# Patient Record
Sex: Male | Born: 1990
Health system: Southern US, Community
[De-identification: ages and names within clinical notes are randomized; demographics above are authoritative.]

## PROBLEM LIST (undated history)

## (undated) DIAGNOSIS — H53009 Unspecified amblyopia, unspecified eye: Secondary | ICD-10-CM

## (undated) DIAGNOSIS — J45909 Unspecified asthma, uncomplicated: Secondary | ICD-10-CM

## (undated) DIAGNOSIS — I10 Essential (primary) hypertension: Secondary | ICD-10-CM

## (undated) HISTORY — DX: Unspecified amblyopia, unspecified eye: H53.009

## (undated) HISTORY — DX: Unspecified asthma, uncomplicated: J45.909

---
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
## 2014-08-07 ENCOUNTER — Emergency Department: Payer: Self-pay | Admitting: Emergency Medicine

## 2015-01-02 ENCOUNTER — Ambulatory Visit (INDEPENDENT_AMBULATORY_CARE_PROVIDER_SITE_OTHER): Payer: PRIVATE HEALTH INSURANCE | Admitting: Family Medicine

## 2015-01-02 ENCOUNTER — Ambulatory Visit
Admission: RE | Admit: 2015-01-02 | Discharge: 2015-01-02 | Disposition: A | Discharge status: Home / Self Care | Payer: PRIVATE HEALTH INSURANCE | Source: Ambulatory Visit | Attending: Family Medicine | Admitting: Family Medicine

## 2015-01-02 ENCOUNTER — Encounter: Payer: Self-pay | Admitting: Family Medicine

## 2015-01-02 ENCOUNTER — Ambulatory Visit
Admission: RE | Admit: 2015-01-02 | Payer: PRIVATE HEALTH INSURANCE | Source: Ambulatory Visit | Admitting: Family Medicine

## 2015-01-02 VITALS — BP 160/64 | HR 80 | Temp 98.1°F | Resp 16 | Ht >= 80 in | Wt 322.0 lb

## 2015-01-02 DIAGNOSIS — G8929 Other chronic pain: Secondary | ICD-10-CM | POA: Insufficient documentation

## 2015-01-02 DIAGNOSIS — H53009 Unspecified amblyopia, unspecified eye: Secondary | ICD-10-CM | POA: Insufficient documentation

## 2015-01-02 DIAGNOSIS — R229 Localized swelling, mass and lump, unspecified: Secondary | ICD-10-CM

## 2015-01-02 DIAGNOSIS — IMO0002 Reserved for concepts with insufficient information to code with codable children: Secondary | ICD-10-CM | POA: Insufficient documentation

## 2015-01-02 DIAGNOSIS — R05 Cough: Secondary | ICD-10-CM

## 2015-01-02 DIAGNOSIS — M545 Low back pain, unspecified: Secondary | ICD-10-CM | POA: Insufficient documentation

## 2015-01-02 DIAGNOSIS — S3210XS Unspecified fracture of sacrum, sequela: Secondary | ICD-10-CM | POA: Diagnosis not present

## 2015-01-02 DIAGNOSIS — R059 Cough, unspecified: Secondary | ICD-10-CM

## 2015-01-02 DIAGNOSIS — Z0000 Encounter for general adult medical examination without abnormal findings: Secondary | ICD-10-CM

## 2015-01-02 MED ORDER — MOMETASONE FUROATE 100 MCG/ACT IN AERO: 2.0000 | INHALATION_SPRAY | Freq: Two times a day (BID) | RESPIRATORY_TRACT | Status: DC

## 2015-01-02 MED ORDER — AZITHROMYCIN 250 MG PO TABS: ORAL_TABLET | ORAL | Status: AC
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
## 2015-01-03 LAB — GLUCOSE, RANDOM: Glucose: 91 mg/dL (ref 65–99)

## 2015-01-03 LAB — LIPID PANEL
CHOLESTEROL TOTAL: 128 mg/dL (ref 100–199)
Chol/HDL Ratio: 3.5 ratio units (ref 0.0–5.0)
HDL: 37 mg/dL — AB (ref >39)
LDL CALC: 69 mg/dL (ref 0–99)
TRIGLYCERIDES: 109 mg/dL (ref 0–149)
VLDL CHOLESTEROL CAL: 22 mg/dL (ref 5–40)

## 2015-03-27 ENCOUNTER — Ambulatory Visit (INDEPENDENT_AMBULATORY_CARE_PROVIDER_SITE_OTHER): Payer: PRIVATE HEALTH INSURANCE | Admitting: Family Medicine

## 2015-03-27 ENCOUNTER — Encounter: Payer: Self-pay | Admitting: Family Medicine

## 2015-03-27 VITALS — BP 148/80 | HR 90 | Temp 98.3°F | Resp 18 | Ht >= 80 in | Wt 297.0 lb

## 2015-03-27 DIAGNOSIS — Z23 Encounter for immunization: Secondary | ICD-10-CM

## 2015-03-27 DIAGNOSIS — Z7251 High risk heterosexual behavior: Secondary | ICD-10-CM | POA: Diagnosis not present

## 2015-03-27 NOTE — Progress Notes (Signed)
       Patient: Joshua Hendrix Male    DOB: [birth year]   24 y.o.   MRN: [id number] Visit Date: 03/27/2015  Today's Provider: [name], MD   Chief Complaint  Patient presents with  . Exposure to STD    possible   Subjective:    Exposure to STD Primary symptoms comment: no current symptoms. The patient is experiencing no pain. Pertinent negatives include no abdominal pain, chest pain, chills, fever, nausea, shortness of breath or vomiting. He is not sexually active. He inconsistently uses condoms.  Patient comes in today requesting to be checked for STD's. Patient states his girlfriend was unfaithful and cheated on him with another guy. Patient does not have any symptoms of STD'S but wants to be checked just for peace of mind. He denies having any systemic or GU symptoms.      No Known Allergies Previous Medications   ACETAMINOPHEN (TYLENOL) 500 MG TABLET    Take 1 tablet by mouth daily as needed.   MOMETASONE FUROATE (ASMANEX HFA) 100 MCG/ACT AERO    Inhale 2 puffs into the lungs 2 (two) times daily.    Review of Systems  Constitutional: Negative for fever, chills and appetite change.  Respiratory: Negative for chest tightness, shortness of breath and wheezing.   Cardiovascular: Negative for chest pain and palpitations.  Gastrointestinal: Negative for nausea, vomiting and abdominal pain.    Social History  Substance Use Topics  . Smoking status: Former Games developer  . Smokeless tobacco: Not on file  . Alcohol Use: No   Objective:   BP 148/80 mmHg  Pulse 90  Temp(Src) 98.3 F (36.8 C) (Oral)  Resp 18  Ht 7' (2.134 m)  Wt 297 lb (134.718 kg)  BMI 29.58 kg/m2  SpO2 100%  Physical Exam  General appearance: alert, well developed, well nourished, cooperative and in no distress Head: Normocephalic, without obvious abnormality, atraumatic Lungs: Respirations even and unlabored Extremities: No gross deformities Skin: Skin color, texture, turgor normal. No rashes seen    Psych: Appropriate mood and affect. Neurologic: Mental status: Alert, oriented to person, place, and time, thought content appropriate.      Assessment & Plan:     1. High-risk sexual behavior  - HIV antibody (with reflex) - RPR - GC/Chlamydia Probe Amp  2. Need for HPV vaccination  - HPV 9-valent vaccine,Recombinat (Gardasil 9)  Return after 1 month for HPV #2 [name], MD  [facility]

## 2015-03-28 LAB — HIV ANTIBODY (ROUTINE TESTING W REFLEX): HIV Screen 4th Generation wRfx: NONREACTIVE

## 2015-03-28 LAB — RPR: RPR Ser Ql: NONREACTIVE

## 2015-03-30 LAB — GC/CHLAMYDIA PROBE AMP
CHLAMYDIA, DNA PROBE: NEGATIVE
Neisseria gonorrhoeae by PCR: NEGATIVE

## 2015-04-27 ENCOUNTER — Ambulatory Visit: Payer: PRIVATE HEALTH INSURANCE | Admitting: Family Medicine

## 2015-05-04 ENCOUNTER — Ambulatory Visit (INDEPENDENT_AMBULATORY_CARE_PROVIDER_SITE_OTHER): Payer: PRIVATE HEALTH INSURANCE | Admitting: Family Medicine

## 2015-05-04 DIAGNOSIS — Z23 Encounter for immunization: Secondary | ICD-10-CM

## 2015-05-07 NOTE — Progress Notes (Signed)
Immunization only

## 2015-12-05 ENCOUNTER — Telehealth: Payer: Self-pay | Admitting: Family Medicine

## 2015-12-05 NOTE — Telephone Encounter (Signed)
Patient was notified he is due now for 3rd HPV inj. Patient scheduled appt.

## 2015-12-05 NOTE — Telephone Encounter (Signed)
Pt states he want to know if he has completed the series of shot that were required for the series.  He is unsure the name of the shots.  He thinks he might have missed the third shot but can not remember.

## 2015-12-06 ENCOUNTER — Ambulatory Visit (INDEPENDENT_AMBULATORY_CARE_PROVIDER_SITE_OTHER): Payer: PRIVATE HEALTH INSURANCE | Admitting: *Deleted

## 2015-12-06 DIAGNOSIS — Z23 Encounter for immunization: Secondary | ICD-10-CM | POA: Diagnosis not present
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
## 2016-11-26 ENCOUNTER — Encounter: Payer: Self-pay | Admitting: Family Medicine

## 2016-11-26 ENCOUNTER — Ambulatory Visit (INDEPENDENT_AMBULATORY_CARE_PROVIDER_SITE_OTHER): Payer: PRIVATE HEALTH INSURANCE | Admitting: Family Medicine

## 2016-11-26 VITALS — BP 134/70 | HR 86 | Temp 98.1°F | Resp 16 | Ht >= 80 in | Wt 333.0 lb

## 2016-11-26 DIAGNOSIS — Z0000 Encounter for general adult medical examination without abnormal findings: Secondary | ICD-10-CM | POA: Diagnosis not present

## 2016-11-26 DIAGNOSIS — Z23 Encounter for immunization: Secondary | ICD-10-CM

## 2016-11-26 DIAGNOSIS — Z3009 Encounter for other general counseling and advice on contraception: Secondary | ICD-10-CM

## 2016-11-26 NOTE — Progress Notes (Deleted)
Patient: Joshua Hendrix, Male    DOB: 05/31/1991, 26 y.o.   MRN: [id number] Visit Date: 11/26/2016  Today's Provider: Lelon Huh, MD   Chief Complaint  Patient presents with  . Annual Exam  . Back Pain   Subjective:    Annual physical exam Joshua Hendrix is a 26 y.o. male who presents today for health maintenance and complete physical. He feels well. He reports exercising running three to four days a week. He reports he is sleeping well.  -----------------------------------------------------------------  He states that he has been considering having a vasectomy for several years and requests referral to urologist. He has no children, but states if he every decides to have children he wants to adopt instead of having his own. He has a long time steady girlfriend and states she feels the same way. He said he is 379% certain he will not want to have children of his own in the future.   Review of Systems  Constitutional: Negative for chills, diaphoresis and fever.  HENT: Negative for congestion, ear discharge, ear pain, hearing loss, nosebleeds, sore throat and tinnitus.   Eyes: Negative for photophobia, pain, discharge and redness.  Respiratory: Negative for cough, shortness of breath, wheezing and stridor.   Cardiovascular: Negative for chest pain, palpitations and leg swelling.  Gastrointestinal: Negative for abdominal pain, blood in stool, constipation, diarrhea, nausea and vomiting.  Endocrine: Negative for polydipsia.  Genitourinary: Negative for dysuria, flank pain, frequency, hematuria and urgency.  Musculoskeletal: Negative for back pain, myalgias and neck pain.  Skin: Negative for rash.  Allergic/Immunologic: Negative for environmental allergies.  Neurological: Negative for dizziness, tremors, seizures, weakness and headaches.  Hematological: Does not bruise/bleed easily.  Psychiatric/Behavioral: Negative for hallucinations and suicidal ideas. The patient is not  nervous/anxious.     Social History      He  reports that he has quit smoking. He has never used smokeless tobacco. He reports that he does not drink alcohol or use drugs.       Social History   Social History  . Marital status: Single    Spouse name: N/A  . Number of children: N/A  . Years of education: N/A   Social History Main Topics  . Smoking status: Former Research scientist (life sciences)  . Smokeless tobacco: Never Used  . Alcohol use No  . Drug use: No  . Sexual activity: Not Asked   Other Topics Concern  . None   Social History Narrative  . None    Past Medical History:  Diagnosis Date  . Amblyopia      Patient Active Problem List   Diagnosis Date Noted  . Amblyopia 01/02/2015  . LBP (low back pain) 01/02/2015  . Mass 01/02/2015    Past Surgical History:  Procedure Laterality Date  . CYST REMOVAL NECK  12/11/2009  . TONSILLECTOMY  1997    Family History        Family Status  Relation Status  . Mother Alive  . Father Alive       Fatty liver  . MGF Deceased at age 70  . Neg Hx (Not Specified)        His family history includes Heart Problems in his mother; Heart attack in his maternal grandfather; Hyperlipidemia in his father; Hypertension in his father; Stroke in his maternal grandfather.     No Known Allergies  No current outpatient prescriptions on file.   Patient Care Team: Birdie Sons, MD as PCP -  General (Family Medicine)      Objective:   Vitals: BP 134/70 (BP Location: Right Arm, Patient Position: Sitting, Cuff Size: Large)   Pulse 86   Temp 98.1 F (36.7 C) (Oral)   Resp 16   Ht 7' (2.134 m)   Wt (!) 333 lb (151 kg)   SpO2 97%   BMI 33.18 kg/m    Vitals:   11/26/16 0915  BP: 134/70  Pulse: 86  Resp: 16  Temp: 98.1 F (36.7 C)  TempSrc: Oral  SpO2: 97%  Weight: (!) 333 lb (151 kg)  Height: 7' (2.134 m)     Physical Exam   General Appearance:    Alert, cooperative, no distress, appears stated age  Head:    Normocephalic,  without obvious abnormality, atraumatic  Eyes:    PERRL, conjunctiva/corneas clear, EOM's intact, fundi    benign, both eyes       Ears:    Normal TM's and external ear canals, both ears  Nose:   Nares normal, septum midline, mucosa normal, no drainage   or sinus tenderness  Throat:   Lips, mucosa, and tongue normal; teeth and gums normal  Neck:   Supple, symmetrical, trachea midline, no adenopathy;       thyroid:  No enlargement/tenderness/nodules; no carotid   bruit or JVD  Back:     Symmetric, no curvature, ROM normal, no CVA tenderness  Lungs:     Clear to auscultation bilaterally, respirations unlabored  Chest wall:    No tenderness or deformity  Heart:    Regular rate and rhythm, S1 and S2 normal, no murmur, rub   or gallop  Abdomen:     Soft, non-tender, bowel sounds active all four quadrants,    no masses, no organomegaly  Genitalia:    deferred  Rectal:    deferred  Extremities:   Extremities normal, atraumatic, no cyanosis or edema  Pulses:   2+ and symmetric all extremities  Skin:   Skin color, texture, turgor normal, no rashes or lesions  Lymph nodes:   Cervical, supraclavicular, and axillary nodes normal  Neurologic:   CNII-XII intact. Normal strength, sensation and reflexes      throughout    Depression Screen PHQ 2/9 Scores 11/26/2016 01/02/2015  PHQ - 2 Score 0 0  PHQ- 9 Score 0 0      Assessment & Plan:     Routine Health Maintenance and Physical Exam  Exercise Activities and Dietary recommendations Goals    None      Immunization History  Administered Date(s) Administered  . DTaP 10/19/1991, 12/22/1991, 02/28/1992, 03/15/1993, 09/26/1996  . HPV 9-valent 03/27/2015, 05/04/2015, 12/06/2015  . Hepatitis A 07/07/2006, 02/28/2009  . Hepatitis B 07/18/2009, 08/22/2009, 11/20/2009  . HiB (PRP-OMP) 10/19/1991, 12/22/1991, 02/28/1992, 03/15/1993  . IPV 10/19/1991, 12/22/1991, 03/15/1993, 09/26/1996  . MMR 11/23/1992, 09/26/1996  . Meningococcal Conjugate  02/28/2009  . Td 07/07/2006  . Tdap 07/07/2006  . Varicella 02/26/1994, 07/07/2006    Health Maintenance  Topic Date Due  . Samul Dada  07/07/2016  . INFLUENZA VACCINE  01/14/2017  . HIV Screening  Completed     Discussed health benefits of physical activity, and encouraged him to engage in regular exercise appropriate for his age and condition.    --------------------------------------------------------------------  1. Annual physical exam Doing well. Last screening labs in 2016 were completely normal.   2. Need for prophylactic vaccination using tetanus and diphtheria toxoids adsorbed (Td) vaccine  - Td : Tetanus/diphtheria >7yo Preservative  free  3. Visit for vasectomy evaluation He feels strongly that he never wants to have children of his own and requests referral to urology to discuss vasectomy.  - Ambulatory referral to Urology   Lelon Huh, MD  [facility]

## 2016-11-26 NOTE — Progress Notes (Signed)
Patient: Joshua Hendrix, Male    DOB: [birth year], 26 y.o.   MRN: [id number] Visit Date: 11/26/2016  Today's Provider: Lelon Huh, MD   Chief Complaint  Patient presents with  . Annual Exam  . Back Pain   Subjective:    Annual physical exam Joshua Hendrix is a 26 y.o. male who presents today for health maintenance and complete physical. He feels well. He reports exercising yes. He reports he is sleeping fairly well.  ----------------------------------------------------------------  He states that he has been considering having a vasectomy for several years and requests referral to urologist. He has no children, but states if he every decides to have children he wants to adopt instead of having his own. He has a long time steady girlfriend and states she feels the same way. He said he is 950% certain he will not want to have children of his own in the future.   Review of Systems  Constitutional: Negative for chills, diaphoresis and fever.  HENT: Negative for congestion, ear discharge, ear pain, hearing loss, nosebleeds, sore throat and tinnitus.   Eyes: Negative for photophobia, pain, discharge and redness.  Respiratory: Negative for cough, shortness of breath, wheezing and stridor.   Cardiovascular: Negative for chest pain, palpitations and leg swelling.  Gastrointestinal: Negative for abdominal pain, blood in stool, constipation, diarrhea, nausea and vomiting.  Endocrine: Negative for polydipsia.  Genitourinary: Negative for dysuria, flank pain, frequency, hematuria and urgency.  Musculoskeletal: Negative for back pain, myalgias and neck pain.  Skin: Negative for rash.  Allergic/Immunologic: Negative for environmental allergies.  Neurological: Negative for dizziness, tremors, seizures, weakness and headaches.  Hematological: Does not bruise/bleed easily.  Psychiatric/Behavioral: Negative for hallucinations and suicidal ideas. The patient is not nervous/anxious.      Social History      He  reports that he has quit smoking. He has never used smokeless tobacco. He reports that he does not drink alcohol or use drugs.       Social History   Social History  . Marital status: Single    Spouse name: N/A  . Number of children: N/A  . Years of education: N/A   Social History Main Topics  . Smoking status: Former Research scientist (life sciences)  . Smokeless tobacco: Never Used  . Alcohol use No  . Drug use: No  . Sexual activity: Not Asked   Other Topics Concern  . None   Social History Narrative  . None    Past Medical History:  Diagnosis Date  . Amblyopia      Patient Active Problem List   Diagnosis Date Noted  . Amblyopia 01/02/2015  . LBP (low back pain) 01/02/2015  . Mass 01/02/2015    Past Surgical History:  Procedure Laterality Date  . CYST REMOVAL NECK  12/11/2009  . TONSILLECTOMY  1997    Family History        Family Status  Relation Status  . Mother Alive  . Father Alive       Fatty liver  . MGF Deceased at age 42  . Neg Hx (Not Specified)        His family history includes Heart Problems in his mother; Heart attack in his maternal grandfather; Hyperlipidemia in his father; Hypertension in his father; Stroke in his maternal grandfather.     No Known Allergies  No current outpatient prescriptions on file.   Patient Care Team: Birdie Sons, MD as PCP - General (Family Medicine)  Objective:   Vitals: BP 134/70 (BP Location: Right Arm, Patient Position: Sitting, Cuff Size: Large)   Pulse 86   Temp 98.1 F (36.7 C) (Oral)   Resp 16   Ht 7' (2.134 m)   Wt (!) 333 lb (151 kg)   SpO2 97%   BMI 33.18 kg/m    Vitals:   11/26/16 0915  BP: 134/70  Pulse: 86  Resp: 16  Temp: 98.1 F (36.7 C)  TempSrc: Oral  SpO2: 97%  Weight: (!) 333 lb (151 kg)  Height: 7' (2.134 m)     Physical Exam   Depression Screen PHQ 2/9 Scores 11/26/2016 01/02/2015  PHQ - 2 Score 0 0  PHQ- 9 Score 0 0      Assessment & Plan:      Routine Health Maintenance and Physical Exam  Exercise Activities and Dietary recommendations Goals    None      Immunization History  Administered Date(s) Administered  . DTaP 10/19/1991, 12/22/1991, 02/28/1992, 03/15/1993, 09/26/1996  . HPV 9-valent 03/27/2015, 05/04/2015, 12/06/2015  . Hepatitis A 07/07/2006, 02/28/2009  . Hepatitis B 07/18/2009, 08/22/2009, 11/20/2009  . HiB (PRP-OMP) 10/19/1991, 12/22/1991, 02/28/1992, 03/15/1993  . IPV 10/19/1991, 12/22/1991, 03/15/1993, 09/26/1996  . MMR 11/23/1992, 09/26/1996  . Meningococcal Conjugate 02/28/2009  . Td 07/07/2006  . Tdap 07/07/2006  . Varicella 02/26/1994, 07/07/2006    Health Maintenance  Topic Date Due  . Samul Dada  07/07/2016  . INFLUENZA VACCINE  01/14/2017  . HIV Screening  Completed     Discussed health benefits of physical activity, and encouraged him to engage in regular exercise appropriate for his age and condition.    -------------------------------------------------------------------- 1. Annual physical exam Doing well. Last screening labs in 2016 were completely normal.   2. Need for prophylactic vaccination using tetanus and diphtheria toxoids adsorbed (Td) vaccine  - Td : Tetanus/diphtheria >7yo Preservative  free  3. Visit for vasectomy evaluation He feels strongly that he never wants to have children of his own and requests referral to urology to discuss vasectomy.  - Ambulatory referral to Urology    Lelon Huh, MD  [facility]

## 2016-12-25 ENCOUNTER — Encounter: Payer: Self-pay | Admitting: Urology

## 2016-12-25 ENCOUNTER — Ambulatory Visit (INDEPENDENT_AMBULATORY_CARE_PROVIDER_SITE_OTHER): Payer: PRIVATE HEALTH INSURANCE | Admitting: Urology

## 2016-12-25 VITALS — BP 147/79 | HR 91 | Ht >= 80 in | Wt 332.9 lb

## 2016-12-25 DIAGNOSIS — Z3009 Encounter for other general counseling and advice on contraception: Secondary | ICD-10-CM | POA: Diagnosis not present

## 2016-12-25 MED ORDER — DIAZEPAM 10 MG PO TABS: 10.0000 mg | ORAL_TABLET | Freq: Once | ORAL | 0 refills | Status: AC

## 2016-12-25 NOTE — Progress Notes (Signed)
12/25/2016 9:48 AM   Dutch Quint [birth year] [id number]  Referring provider: Malva Limes, MD [address] [place], [place] [address]  Chief Complaint  Patient presents with  . New Patient (Initial Visit)    Vas consult referred by     HPI: The patient is a 26 year old gentleman who presents today for evaluation for a vasectomy. He is currently engaged. He had his fiance desire no biological children and plan to adopt if they want children down the road. His fiance does have anatomical abnormalities of her uterus which makes pregnancy dangerous as well which also was guided his decision to undergo a vasectomy. He is aware that this is meant to be a permanent procedure. He has fiance have given him a lot of thought about this procedure and would like to move forward for vasectomy. He has no other urological history or problems. He voids well. No previous genitourinary surgeries. No history of kidney stones.     PMH: Past Medical History:  Diagnosis Date  . Amblyopia     Surgical History: Past Surgical History:  Procedure Laterality Date  . CYST REMOVAL NECK  12/11/2009  . TONSILLECTOMY  1997    Home Medications:  Allergies as of 12/25/2016   No Known Allergies     Medication List       Accurate as of 12/25/16  9:48 AM. Always use your most recent med list.          cetirizine 10 MG chewable tablet Commonly known as:  ZYRTEC Chew 10 mg by mouth daily.   diazepam 10 MG tablet Commonly known as:  VALIUM Take 1 tablet (10 mg total) by mouth once.   fexofenadine 180 MG tablet Commonly known as:  ALLEGRA Take 180 mg by mouth daily.       Allergies:  No Known Allergies  Family History: Family History  Problem Relation Age of Onset  . Heart Problems Mother        Heart valve problem  . Hypertension Father   . Hyperlipidemia Father   . Heart attack Maternal Grandfather        3-4 heart attacks occured in his 40's  . Stroke Maternal  Grandfather   . Cancer Neg Hx   . Diabetes Neg Hx   . Prostate cancer Neg Hx   . Kidney cancer Neg Hx   . Bladder Cancer Neg Hx     Social History:  reports that he has quit smoking. He has never used smokeless tobacco. He reports that he does not drink alcohol or use drugs.  ROS: UROLOGY Frequent Urination?: No Hard to postpone urination?: No Burning/pain with urination?: No Get up at night to urinate?: No Leakage of urine?: No Urine stream starts and stops?: No Trouble starting stream?: No Do you have to strain to urinate?: No Blood in urine?: No Urinary tract infection?: No Sexually transmitted disease?: No Injury to kidneys or bladder?: No Painful intercourse?: No Weak stream?: No Erection problems?: No Penile pain?: No  Gastrointestinal Nausea?: No Vomiting?: No Indigestion/heartburn?: No Diarrhea?: No Constipation?: No  Constitutional Fever: No Night sweats?: No Weight loss?: No Fatigue?: No  Skin Skin rash/lesions?: No Itching?: No  Eyes Blurred vision?: No Double vision?: No  Ears/Nose/Throat Sore throat?: No Sinus problems?: No  Hematologic/Lymphatic Swollen glands?: No Easy bruising?: No  Cardiovascular Leg swelling?: No Chest pain?: No  Respiratory Cough?: No Shortness of breath?: No  Endocrine Excessive thirst?: No  Musculoskeletal Back pain?: No Joint  pain?: No  Neurological Headaches?: No Dizziness?: No  Psychologic Depression?: No Anxiety?: No  Physical Exam: BP (!) 147/79   Pulse 91   Ht 7' (2.134 m)   Wt (!) 332 lb 14.4 oz (151 kg)   BMI 33.17 kg/m   Constitutional:  Alert and oriented, No acute distress. HEENT: [place] AT, moist mucus membranes.  Trachea midline, no masses. Cardiovascular: No clubbing, cyanosis, or edema. Respiratory: Normal respiratory effort, no increased work of breathing. GI: Abdomen is soft, nontender, nondistended, no abdominal masses GU: No CVA tenderness. Normal phallus. Testicles  descended equally bilaterally. Vas palpable bilaterally. Skin: No rashes, bruises or suspicious lesions. Lymph: No cervical or inguinal adenopathy. Neurologic: Grossly intact, no focal deficits, moving all 4 extremities. Psychiatric: Normal mood and affect.  Laboratory Data: No results found for: WBC, HGB, HCT, MCV, PLT  No results found for: CREATININE  No results found for: PSA  No results found for: TESTOSTERONE  No results found for: HGBA1C  Urinalysis No results found for: COLORURINE, APPEARANCEUR, LABSPEC, PHURINE, GLUCOSEU, HGBUR, BILIRUBINUR, KETONESUR, PROTEINUR, UROBILINOGEN, NITRITE, LEUKOCYTESUR   Assessment & Plan:    Today, we discussed what the vas deferens is, where it is located, and its function. We reviewed the procedure for vasectomy, it's risks, benefits, alternatives, and likelihood of achieving his goals. We discussed in detail the procedure, complications, and recovery as well as the need for clearance prior to unprotected intercourse. We discussed that vasectomy does not protect against sexually transmitted diseases. We discussed that this procedure does not result in immediate sterility and that they would need to use other forms of birth control until he has been cleared with negative postvasectomy semen analyses. I explained that the procedure is considered to be permanent and that attempts at reversal have varying degrees of success. These options include vasectomy reversal, sperm retrieval, and in vitro fertilization; these can be very expensive. We discussed the chance of postvasectomy pain syndrome which occurs in less than 5% of patients. I explained to the patient that there is no treatment to resolve this chronic pain, and that if it developed I would not be able to help resolve the issue, but that surgery is generally not needed for correction. I explained there have even been reports of systemic like illness associated with this chronic pain, and that there  was no good cure. I explained that vasectomy it is not a 100% reliable form of birth control, and the risk of pregnancy after vasectomy is approximately 1 in 2000 men who had a negative postvasectomy semen analysis or rare non-motile sperm. I explained that repeat vasectomy was necessary in less than 1% of vasectomy procedures when employing the type of technique that I use. I explained that he should refrain from ejaculation for approximately one week following vasectomy. I explained that there are other options for birth control which are permanent and non-permanent; we discussed these. I explained the rates of surgical complications, such as symptomatic hematoma or infection, are low (1-2%) and vary with the surgeon's experience and criteria used to diagnose the complication.  The patient had the opportunity to ask questions to his stated satisfaction. He voiced understanding of the above factors and stated that he has read all the information provided to him and the packets and informed consent  1. Family planning -vasectomy  Return for vasectomy.  Hildred LaserBrian James Isaiah Torok, MD  [facility] [address], [address] [place], [place] [id number] [phone]

## 2017-01-16 ENCOUNTER — Ambulatory Visit (INDEPENDENT_AMBULATORY_CARE_PROVIDER_SITE_OTHER): Payer: PRIVATE HEALTH INSURANCE | Admitting: Urology

## 2017-01-16 ENCOUNTER — Encounter: Payer: Self-pay | Admitting: Urology

## 2017-01-16 VITALS — BP 152/90 | HR 87 | Ht >= 80 in | Wt 330.0 lb

## 2017-01-16 DIAGNOSIS — Z3009 Encounter for other general counseling and advice on contraception: Secondary | ICD-10-CM

## 2017-01-16 MED ORDER — OXYCODONE-ACETAMINOPHEN 5-325 MG PO TABS: 1.0000 | ORAL_TABLET | ORAL | 0 refills | Status: DC | PRN

## 2017-01-16 NOTE — Progress Notes (Signed)
Bilateral Vasectomy Procedure  Pre-Procedure: - Patient's scrotum was prepped and draped for vasectomy. - The vas was palpated through the scrotal skin on the left. - 1% Xylocaine was injected into the skin and surrounding tissue for placement  - In a similar manner, the vas on the right was identified, anesthetized, and stabilized.  Procedure: - A scalpel was used to make a 1 cm incision in his left hemiscrotum - The left vas was isolated and brought up through the incision exposing that structure. - Bleeding points were cauterized as they occurred. - The vas was free from the surrounding structures and brought into view. - A segment was positioned for placement with a hemostat. - A second hemostat was placed and a small segment between the two hemostats and was removed for inspection. - Each end of the transected vas lumen was fulgurated/obliterated using needlepoint electrocautery. It was then tied off with a #2-0 silk suture -The same procedure was performed on the right. - A suture of #3-0 chromic catgut was used to close each lateral scrotal skin incision  Post-Procedure: - Patient was instructed in care of the operative area - A specimen is to be delivered in 12 and 16 weeks   -Another form of contraception is to be used until he is cleared by the office.   

## 2017-06-24 ENCOUNTER — Other Ambulatory Visit: Payer: 59

## 2017-06-24 DIAGNOSIS — Z9852 Vasectomy status: Secondary | ICD-10-CM

## 2017-06-25 LAB — POST-VAS SPERM EVALUATION,QUAL: SEMEN VOLUME: 2.7 mL

## 2017-07-07 ENCOUNTER — Telehealth: Payer: Self-pay | Admitting: Urology

## 2017-07-07 NOTE — Telephone Encounter (Signed)
Pt called [facility] voiced concerns of why he hasn't gotten a call about his semen results.  Pt asks that someone calls him with these results please. Please advise. Thanks.

## 2017-07-07 NOTE — Telephone Encounter (Signed)
Had Dr. Lonna CobbStoioff look at the labs and the patient is cleared for normal activity. Patient requested a copy of the lab mailed to him so I mailed it out to him.  Marcelino DusterMichelle

## 2017-08-13 ENCOUNTER — Other Ambulatory Visit: Payer: 59

## 2017-08-13 DIAGNOSIS — Z9852 Vasectomy status: Secondary | ICD-10-CM

## 2017-08-14 LAB — POST-VAS SPERM EVALUATION,QUAL: Volume: 3 mL

## 2017-08-20 ENCOUNTER — Telehealth: Payer: Self-pay | Admitting: Urology

## 2017-08-20 NOTE — Telephone Encounter (Signed)
Per Dr. Sherryl BartersBudzyn ok to tell patient that no sperm were found and he was clear.  Joshua Hendrix   Mailed a copy of the results to the patient per his request

## 2017-11-19 DIAGNOSIS — S92514A Nondisplaced fracture of proximal phalanx of right lesser toe(s), initial encounter for closed fracture: Secondary | ICD-10-CM | POA: Diagnosis not present

## 2018-01-06 ENCOUNTER — Ambulatory Visit: Payer: 59 | Admitting: Physician Assistant

## 2018-01-06 ENCOUNTER — Encounter: Payer: Self-pay | Admitting: Physician Assistant

## 2018-01-06 VITALS — BP 146/66 | HR 92 | Temp 98.8°F | Resp 16 | Ht >= 80 in | Wt 362.0 lb

## 2018-01-06 DIAGNOSIS — Z1329 Encounter for screening for other suspected endocrine disorder: Secondary | ICD-10-CM | POA: Diagnosis not present

## 2018-01-06 DIAGNOSIS — I10 Essential (primary) hypertension: Secondary | ICD-10-CM

## 2018-01-06 DIAGNOSIS — G5622 Lesion of ulnar nerve, left upper limb: Secondary | ICD-10-CM | POA: Diagnosis not present

## 2018-01-06 DIAGNOSIS — Z13220 Encounter for screening for lipoid disorders: Secondary | ICD-10-CM | POA: Diagnosis not present

## 2018-01-06 DIAGNOSIS — Z130 Encounter for screening for diseases of the blood and blood-forming organs and certain disorders involving the immune mechanism: Secondary | ICD-10-CM

## 2018-01-06 NOTE — Patient Instructions (Signed)
Call [facility] - cubital tunnel syndrome

## 2018-01-06 NOTE — Progress Notes (Signed)
Patient: Joshua Hendrix Male    DOB: 1990/07/14   27 y.o.   MRN: [id number] Visit Date: 01/06/2018  Today's Provider: Trey Sailors, PA-C   Chief Complaint  Patient presents with  . Numbness   Subjective:    HPI Pt comes in today complaining left hand and finger numbness.  He reports it has been going on for awhile but worsening in the last two weeks.  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 He has numbness and tingling in the medial half of his left fourth and entire fifth finger. He denies weakness. He denies injury. He denies symptoms in his first three fingers of his left hand.   Additionally, he has high blood pressure documented today and over the past year. 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 He denies chest pain and SOB.   BP Readings from Last 3 Encounters:  01/06/18 (!) 146/66  01/16/17 (!) 152/90  12/25/16 (!) 147/79   Wt Readings from Last 3 Encounters:  01/06/18 (!) 362 lb (164.2 kg)  01/16/17 (!) 330 lb (149.7 kg)  12/25/16 (!) 332 lb 14.4 oz (151 kg)        No Known Allergies   Current Outpatient Medications:  .  cetirizine (ZYRTEC) 10 MG chewable tablet, Chew 10 mg by mouth daily., Disp: , Rfl:  .  diazepam (VALIUM) 10 MG tablet, TAKE 1 TABLET BY MOUTH ONCE, Disp: , Rfl: 0 .  fexofenadine (ALLEGRA) 180 MG tablet, Take 180 mg by mouth daily., Disp: , Rfl:  .  oxyCODONE-acetaminophen (ROXICET) 5-325 MG tablet, Take 1 tablet by mouth every 4 (four) hours as needed for severe pain. (Patient not taking: Reported on 01/06/2018), Disp: 30 tablet, Rfl: 0  Review of Systems  Social History   Tobacco Use  . Smoking status: Former Games developer  . Smokeless tobacco: Never Used  . Tobacco comment: social smoker only  Substance Use Topics  . Alcohol use: No    Alcohol/week: 0.0 oz   Objective:   BP (!) 146/66 (BP Location: Right Arm, Patient Position: Sitting, Cuff Size: Large)    Pulse 92   Temp 98.8 F (37.1 C) (Oral)   Resp 16   Ht 7' (2.134 m)   Wt (!) 362 lb (164.2 kg)   BMI 36.07 kg/m  Vitals:   01/06/18 1449  BP: (!) 146/66  Pulse: 92  Resp: 16  Temp: 98.8 F (37.1 C)  TempSrc: Oral  Weight: (!) 362 lb (164.2 kg)  Height: 7' (2.134 m)     Physical Exam  Constitutional: He is oriented to person, place, and time. He appears well-developed and well-nourished.  Cardiovascular: Normal rate and regular rhythm.  Pulmonary/Chest: Effort normal and breath sounds normal.  Musculoskeletal: Normal range of motion.       Left elbow: Normal. He exhibits normal range of motion, no swelling, no effusion, no deformity and no laceration. No tenderness found. No radial head, no medial epicondyle, no lateral epicondyle and no olecranon process tenderness noted.  Do not appreciate ulnar nerve subluxation on exam.   Neurological: He is alert and oriented to person, place, and time. He has normal strength. A sensory deficit is present.  Reflex Scores:      Bicep reflexes are 2+ on the right side and 2+ on the left side. Decreased sensation in left 4th and 5th fingers.  Skin: Skin is warm and dry.  Psychiatric: He has a normal mood and affect. His behavior is normal.        Assessment & Plan:     1. Cubital tunnel syndrome on left  Counseled on behavioral modifications that may help. However, he is a patient at [facility] and do suggest calling them for appointment for further evaluation.   3. Thyroid disorder screening  - TSH  4. Lipid screening  - Lipid Profile  5. Hypertension, unspecified type  His blood pressure has been elevated over the course of the year. Will have him get baseline labwork and follow up with Dr. Sherrie MustacheFisher for treatment of this.   - Comprehensive Metabolic Panel (CMET)  6. Screening for deficiency anemia  - CBC with Differential  Return in about 1 month (around 02/06/2018) for CPE with Dr. Sherrie MustacheFisher .  The entirety of the  information documented in the History of Present Illness, Review of Systems and Physical Exam were personally obtained by me. Portions of this information were initially documented by Kavin LeechLaura Walsh, CMA and reviewed by me for thoroughness and accuracy.         Trey SailorsAdriana M Mitul Hallowell, PA-C  [facility]

## 2018-01-14 DIAGNOSIS — Z13220 Encounter for screening for lipoid disorders: Secondary | ICD-10-CM | POA: Diagnosis not present

## 2018-01-14 DIAGNOSIS — I10 Essential (primary) hypertension: Secondary | ICD-10-CM | POA: Diagnosis not present

## 2018-01-14 DIAGNOSIS — Z1329 Encounter for screening for other suspected endocrine disorder: Secondary | ICD-10-CM | POA: Diagnosis not present

## 2018-01-15 ENCOUNTER — Telehealth: Payer: Self-pay

## 2018-01-15 LAB — LIPID PANEL
Chol/HDL Ratio: 4.1 ratio (ref 0.0–5.0)
Cholesterol, Total: 162 mg/dL (ref 100–199)
HDL: 40 mg/dL (ref >39)
LDL Calculated: 101 mg/dL — ABNORMAL HIGH (ref 0–99)
Triglycerides: 104 mg/dL (ref 0–149)
VLDL Cholesterol Cal: 21 mg/dL (ref 5–40)

## 2018-01-15 LAB — COMPREHENSIVE METABOLIC PANEL
ALT: 31 IU/L (ref 0–44)
AST: 23 IU/L (ref 0–40)
Albumin/Globulin Ratio: 2.5 — ABNORMAL HIGH (ref 1.2–2.2)
Albumin: 4.7 g/dL (ref 3.5–5.5)
Alkaline Phosphatase: 60 IU/L (ref 39–117)
BUN/Creatinine Ratio: 9 (ref 9–20)
BUN: 9 mg/dL (ref 6–20)
Bilirubin Total: 0.7 mg/dL (ref 0.0–1.2)
CO2: 22 mmol/L (ref 20–29)
Calcium: 9.1 mg/dL (ref 8.7–10.2)
Chloride: 103 mmol/L (ref 96–106)
Creatinine, Ser: 1.03 mg/dL (ref 0.76–1.27)
GFR calc Af Amer: 115 mL/min/{1.73_m2} (ref >59)
GFR calc non Af Amer: 100 mL/min/{1.73_m2} (ref >59)
Globulin, Total: 1.9 g/dL (ref 1.5–4.5)
Glucose: 98 mg/dL (ref 65–99)
Potassium: 4.2 mmol/L (ref 3.5–5.2)
Sodium: 142 mmol/L (ref 134–144)
Total Protein: 6.6 g/dL (ref 6.0–8.5)

## 2018-01-15 LAB — CBC WITH DIFFERENTIAL/PLATELET
Basophils Absolute: 0 10*3/uL (ref 0.0–0.2)
Basos: 1 %
EOS (ABSOLUTE): 0.2 10*3/uL (ref 0.0–0.4)
Eos: 5 %
Hematocrit: 43.1 % (ref 37.5–51.0)
Hemoglobin: 14.8 g/dL (ref 13.0–17.7)
Immature Grans (Abs): 0 10*3/uL (ref 0.0–0.1)
Immature Granulocytes: 0 %
Lymphocytes Absolute: 1.3 10*3/uL (ref 0.7–3.1)
Lymphs: 30 %
MCH: 29.6 pg (ref 26.6–33.0)
MCHC: 34.3 g/dL (ref 31.5–35.7)
MCV: 86 fL (ref 79–97)
Monocytes Absolute: 0.4 10*3/uL (ref 0.1–0.9)
Monocytes: 10 %
Neutrophils Absolute: 2.3 10*3/uL (ref 1.4–7.0)
Neutrophils: 54 %
Platelets: 174 10*3/uL (ref 150–450)
RBC: 5 x10E6/uL (ref 4.14–5.80)
RDW: 13.5 % (ref 12.3–15.4)
WBC: 4.3 10*3/uL (ref 3.4–10.8)

## 2018-01-15 LAB — TSH: TSH: 1.49 u[IU]/mL (ref 0.450–4.500)

## 2018-01-15 NOTE — Telephone Encounter (Signed)
Pt's mom Gavin PoundDeborah ([place]) advised.   Thanks,   -Vernona RiegerLaura

## 2018-01-15 NOTE — Telephone Encounter (Signed)
-----   Message from Trey SailorsAdriana M Pollak, [place]PA-C sent at 01/15/2018  9:05 AM EDT ----- Labs normal, please follow up with Dr. Sherrie MustacheFisher to talk about BP.

## 2018-03-16 ENCOUNTER — Encounter: Payer: Self-pay | Admitting: Family Medicine

## 2018-03-16 ENCOUNTER — Ambulatory Visit: Payer: 59 | Admitting: Family Medicine

## 2018-03-16 VITALS — BP 135/76 | HR 84 | Temp 98.2°F | Resp 16 | Ht >= 80 in | Wt 365.0 lb

## 2018-03-16 DIAGNOSIS — M545 Low back pain, unspecified: Secondary | ICD-10-CM

## 2018-03-16 DIAGNOSIS — Z0000 Encounter for general adult medical examination without abnormal findings: Secondary | ICD-10-CM | POA: Diagnosis not present

## 2018-03-16 DIAGNOSIS — E669 Obesity, unspecified: Secondary | ICD-10-CM | POA: Insufficient documentation

## 2018-03-16 DIAGNOSIS — I10 Essential (primary) hypertension: Secondary | ICD-10-CM

## 2018-03-16 DIAGNOSIS — E66812 Obesity, class 2: Secondary | ICD-10-CM

## 2018-03-16 DIAGNOSIS — Z6836 Body mass index (BMI) 36.0-36.9, adult: Secondary | ICD-10-CM

## 2018-03-16 DIAGNOSIS — Z23 Encounter for immunization: Secondary | ICD-10-CM

## 2018-03-16 MED ORDER — AMLODIPINE BESYLATE 5 MG PO TABS: 5.0000 mg | ORAL_TABLET | Freq: Every day | ORAL | 1 refills | Status: DC

## 2018-03-16 NOTE — Progress Notes (Signed)
Patient: Joshua Hendrix, Male    DOB: Apr 19, 1991, 27 y.o.   MRN: [id number] Visit Date: 03/16/2018  Today's Provider: Lelon Huh, MD   No chief complaint on file.  Subjective:    Annual physical exam Joshua Hendrix is a 27 y.o. male who presents today for health maintenance and complete physical. He feels well. He reports exercising most days with walking. Marland Kitchen He reports he is sleeping fairly well.  -----------------------------------------------------------------   Hypertension, follow-up:  BP Readings from Last 3 Encounters:  01/06/18 (!) 146/66  01/16/17 (!) 152/90  12/25/16 (!) 147/79    He was last seen for hypertension 2 months ago.  BP at that visit was 146/66. Management since that visit includes; seen by Carles Collet. Labs ordered. Patient advised to follow up with pcp  Results for orders placed or performed in visit on 01/06/18  Comprehensive Metabolic Panel (CMET)  Result Value Ref Range   Glucose 98 65 - 99 mg/dL   BUN 9 6 - 20 mg/dL   Creatinine, Ser 1.03 0.76 - 1.27 mg/dL   GFR calc non Af Amer 100 >59 mL/min/1.73   GFR calc Af Amer 115 >59 mL/min/1.73   BUN/Creatinine Ratio 9 9 - 20   Sodium 142 134 - 144 mmol/L   Potassium 4.2 3.5 - 5.2 mmol/L   Chloride 103 96 - 106 mmol/L   CO2 22 20 - 29 mmol/L   Calcium 9.1 8.7 - 10.2 mg/dL   Total Protein 6.6 6.0 - 8.5 g/dL   Albumin 4.7 3.5 - 5.5 g/dL   Globulin, Total 1.9 1.5 - 4.5 g/dL   Albumin/Globulin Ratio 2.5 (H) 1.2 - 2.2   Bilirubin Total 0.7 0.0 - 1.2 mg/dL   Alkaline Phosphatase 60 39 - 117 IU/L   AST 23 0 - 40 IU/L   ALT 31 0 - 44 IU/L  TSH  Result Value Ref Range   TSH 1.490 0.450 - 4.500 uIU/mL  Lipid Profile  Result Value Ref Range   Cholesterol, Total 162 100 - 199 mg/dL   Triglycerides 104 0 - 149 mg/dL   HDL 40 >39 mg/dL   VLDL Cholesterol Cal 21 5 - 40 mg/dL   LDL Calculated 101 (H) 0 - 99 mg/dL   Chol/HDL Ratio 4.1 0.0 - 5.0 ratio  CBC with Differential  Result Value Ref  Range   WBC 4.3 3.4 - 10.8 x10E3/uL   RBC 5.00 4.14 - 5.80 x10E6/uL   Hemoglobin 14.8 13.0 - 17.7 g/dL   Hematocrit 43.1 37.5 - 51.0 %   MCV 86 79 - 97 fL   MCH 29.6 26.6 - 33.0 pg   MCHC 34.3 31.5 - 35.7 g/dL   RDW 13.5 12.3 - 15.4 %   Platelets 174 150 - 450 x10E3/uL   Neutrophils 54 Not Estab. %   Lymphs 30 Not Estab. %   Monocytes 10 Not Estab. %   Eos 5 Not Estab. %   Basos 1 Not Estab. %   Neutrophils Absolute 2.3 1.4 - 7.0 x10E3/uL   Lymphocytes Absolute 1.3 0.7 - 3.1 x10E3/uL   Monocytes Absolute 0.4 0.1 - 0.9 x10E3/uL   EOS (ABSOLUTE) 0.2 0.0 - 0.4 x10E3/uL   Basophils Absolute 0.0 0.0 - 0.2 x10E3/uL   Immature Granulocytes 0 Not Estab. %   Immature Grans (Abs) 0.0 0.0 - 0.1 x10E3/uL     ------------------------------------------------------------------------    Review of Systems  Constitutional: Negative for chills, diaphoresis and fever.  HENT:  Negative for congestion, ear discharge, ear pain, hearing loss, nosebleeds, sore throat and tinnitus.   Eyes: Negative for photophobia, pain, discharge and redness.       Dry eyes  Respiratory: Negative for cough, shortness of breath, wheezing and stridor.   Cardiovascular: Negative for chest pain, palpitations and leg swelling.  Gastrointestinal: Negative for abdominal pain, blood in stool, constipation, diarrhea, nausea and vomiting.  Endocrine: Negative for polydipsia.  Genitourinary: Negative for dysuria, flank pain, frequency, hematuria and urgency.  Musculoskeletal: Negative for back pain, myalgias and neck pain.  Skin: Negative for rash.  Allergic/Immunologic: Negative for environmental allergies.  Neurological: Negative for dizziness, tremors, seizures, weakness and headaches.  Hematological: Does not bruise/bleed easily.  Psychiatric/Behavioral: Negative for hallucinations and suicidal ideas. The patient is not nervous/anxious.     Social History      He  reports that he has quit smoking. He has never used  smokeless tobacco. He reports that he does not drink alcohol or use drugs.       Social History   Socioeconomic History  . Marital status: Married    Spouse name: Not on file  . Number of children: Not on file  . Years of education: Not on file  . Highest education level: Not on file  Occupational History  . Not on file  Social Needs  . Financial resource strain: Not on file  . Food insecurity:    Worry: Not on file    Inability: Not on file  . Transportation needs:    Medical: Not on file    Non-medical: Not on file  Tobacco Use  . Smoking status: Former Research scientist (life sciences)  . Smokeless tobacco: Never Used  . Tobacco comment: social smoker only  Substance and Sexual Activity  . Alcohol use: No    Alcohol/week: 0.0 standard drinks  . Drug use: No  . Sexual activity: Not on file  Lifestyle  . Physical activity:    Days per week: Not on file    Minutes per session: Not on file  . Stress: Not on file  Relationships  . Social connections:    Talks on phone: Not on file    Gets together: Not on file    Attends religious service: Not on file    Active member of club or organization: Not on file    Attends meetings of clubs or organizations: Not on file    Relationship status: Not on file  Other Topics Concern  . Not on file  Social History Narrative  . Not on file    Past Medical History:  Diagnosis Date  . Amblyopia      Patient Active Problem List   Diagnosis Date Noted  . Amblyopia 01/02/2015  . LBP (low back pain) 01/02/2015  . Mass 01/02/2015    Past Surgical History:  Procedure Laterality Date  . CYST REMOVAL NECK  12/11/2009  . TONSILLECTOMY  1997    Family History        Family Status  Relation Name Status  . Mother  Alive  . Father  Alive       Fatty liver  . MGF  Deceased at age 91  . Neg Hx  (Not Specified)        His family history includes Heart Problems in his mother; Heart attack in his maternal grandfather; Hyperlipidemia in his father;  Hypertension in his father; Stroke in his maternal grandfather. There is no history of Cancer, Diabetes, Prostate cancer, Kidney cancer, or  Bladder Cancer.      No Known Allergies   Current Outpatient Medications:  .  cetirizine (ZYRTEC) 10 MG chewable tablet, Chew 10 mg by mouth daily., Disp: , Rfl:  .  diazepam (VALIUM) 10 MG tablet, TAKE 1 TABLET BY MOUTH ONCE, Disp: , Rfl: 0 .  fexofenadine (ALLEGRA) 180 MG tablet, Take 180 mg by mouth daily., Disp: , Rfl:  .  oxyCODONE-acetaminophen (ROXICET) 5-325 MG tablet, Take 1 tablet by mouth every 4 (four) hours as needed for severe pain. (Patient not taking: Reported on 01/06/2018), Disp: 30 tablet, Rfl: 0   Patient Care Team: Birdie Sons, MD as PCP - General (Family Medicine)      Objective:   Vitals: BP 135/76 (BP Location: Right Arm, Patient Position: Sitting, Cuff Size: Large)   Pulse 84   Temp 98.2 F (36.8 C) (Oral)   Resp 16   Ht 7' (2.134 m)   Wt (!) 365 lb (165.6 kg)   SpO2 98%   BMI 36.37 kg/m      Physical Exam   General Appearance:    Alert, cooperative, no distress, appears stated age  Head:    Normocephalic, without obvious abnormality, atraumatic  Eyes:    PERRL, conjunctiva/corneas clear, EOM's intact, fundi    benign, both eyes       Ears:    Normal TM's and external ear canals, both ears  Nose:   Nares normal, septum midline, mucosa normal, no drainage   or sinus tenderness  Throat:   Lips, mucosa, and tongue normal; teeth and gums normal  Neck:   Supple, symmetrical, trachea midline, no adenopathy;       thyroid:  No enlargement/tenderness/nodules; no carotid   bruit or JVD  Back:     Symmetric, no curvature, ROM normal, no CVA tenderness  Lungs:     Clear to auscultation bilaterally, respirations unlabored  Chest wall:    No tenderness or deformity  Heart:    Regular rate and rhythm, S1 and S2 normal, no murmur, rub   or gallop  Abdomen:     Soft, non-tender, bowel sounds active all four quadrants,     no masses, no organomegaly  Genitalia:    deferred  Rectal:    deferred  Extremities:   Extremities normal, atraumatic, no cyanosis or edema  Pulses:   2+ and symmetric all extremities  Skin:   Skin color, texture, turgor normal, no rashes or lesions  Lymph nodes:   Cervical, supraclavicular, and axillary nodes normal  Neurologic:   CNII-XII intact. Normal strength, sensation and reflexes      throughout    Depression Screen PHQ 2/9 Scores 11/26/2016 01/02/2015  PHQ - 2 Score 0 0  PHQ- 9 Score 0 0      Assessment & Plan:     Routine Health Maintenance and Physical Exam  Exercise Activities and Dietary recommendations Goals   None     Immunization History  Administered Date(s) Administered  . DTaP 10/19/1991, 12/22/1991, 02/28/1992, 03/15/1993, 09/26/1996  . HPV 9-valent 03/27/2015, 05/04/2015, 12/06/2015  . Hepatitis A 07/07/2006, 02/28/2009  . Hepatitis B 07/18/2009, 08/22/2009, 11/20/2009  . HiB (PRP-OMP) 10/19/1991, 12/22/1991, 02/28/1992, 03/15/1993  . IPV 10/19/1991, 12/22/1991, 03/15/1993, 09/26/1996  . MMR 11/23/1992, 09/26/1996  . Meningococcal Conjugate 02/28/2009  . Td 11/26/2016  . Tdap 07/07/2006  . Varicella 02/26/1994, 07/07/2006    Health Maintenance  Topic Date Due  . INFLUENZA VACCINE  01/14/2018  . TETANUS/TDAP  11/27/2026  . HIV Screening  Completed     Discussed health benefits of physical activity, and encouraged him to engage in regular exercise appropriate for his age and condition.    --------------------------------------------------------------------  1. Annual physical exam Doing well.   2. Hypertension, unspecified type Counseled regarding prudent diet and regular exercise.  start- amLODipine (NORVASC) 5 MG tablet; Take 1 tablet (5 mg total) by mouth daily.  Dispense: 30 tablet; Refill: 1  3. Low back pain without sciatica, unspecified back pain laterality, unspecified chronicity He does take ibuprofen several times a  week.   4. Class 2 severe obesity with serious comorbidity and body mass index (BMI) of 36.0 to 36.9 in adult, unspecified obesity type ([place])  Flu vaccine today.   Return in about 8 weeks (around 05/11/2018).    Lelon Huh, MD  [facility]

## 2018-03-16 NOTE — Patient Instructions (Signed)
DASH Eating Plan DASH stands for "Dietary Approaches to Stop Hypertension." The DASH eating plan is a healthy eating plan that has been shown to reduce high blood pressure (hypertension). It may also reduce your risk for type 2 diabetes, heart disease, and stroke. The DASH eating plan may also help with weight loss. What are tips for following this plan? General guidelines  Avoid eating more than 2,300 mg (milligrams) of salt (sodium) a day. If you have hypertension, you may need to reduce your sodium intake to 1,500 mg a day.  Limit alcohol intake to no more than 1 drink a day for nonpregnant women and 2 drinks a day for men. One drink equals 12 oz of beer, 5 oz of wine, or 1 oz of hard liquor.  Work with your health care provider to maintain a healthy body weight or to lose weight. Ask what an ideal weight is for you.  Get at least 30 minutes of exercise that causes your heart to beat faster (aerobic exercise) most days of the week. Activities may include walking, swimming, or biking.  Work with your health care provider or diet and nutrition specialist (dietitian) to adjust your eating plan to your individual calorie needs. Reading food labels  Check food labels for the amount of sodium per serving. Choose foods with less than 5 percent of the Daily Value of sodium. Generally, foods with less than 300 mg of sodium per serving fit into this eating plan.  To find whole grains, look for the word "whole" as the first word in the ingredient list. Shopping  Buy products labeled as "low-sodium" or "no salt added."  Buy fresh foods. Avoid canned foods and premade or frozen meals. Cooking  Avoid adding salt when cooking. Use salt-free seasonings or herbs instead of table salt or sea salt. Check with your health care provider or pharmacist before using salt substitutes.  Do not fry foods. Cook foods using healthy methods such as baking, boiling, grilling, and broiling instead.  Cook with  heart-healthy oils, such as olive, canola, soybean, or sunflower oil. Meal planning   Eat a balanced diet that includes: ? 5 or more servings of fruits and vegetables each day. At each meal, try to fill half of your plate with fruits and vegetables. ? Up to 6-8 servings of whole grains each day. ? Less than 6 oz of lean meat, poultry, or fish each day. A 3-oz serving of meat is about the same size as a deck of cards. One egg equals 1 oz. ? 2 servings of low-fat dairy each day. ? A serving of nuts, seeds, or beans 5 times each week. ? Heart-healthy fats. Healthy fats called Omega-3 fatty acids are found in foods such as flaxseeds and coldwater fish, like sardines, salmon, and mackerel.  Limit how much you eat of the following: ? Canned or prepackaged foods. ? Food that is high in trans fat, such as fried foods. ? Food that is high in saturated fat, such as fatty meat. ? Sweets, desserts, sugary drinks, and other foods with added sugar. ? Full-fat dairy products.  Do not salt foods before eating.  Try to eat at least 2 vegetarian meals each week.  Eat more home-cooked food and less restaurant, buffet, and fast food.  When eating at a restaurant, ask that your food be prepared with less salt or no salt, if possible. What foods are recommended? The items listed may not be a complete list. Talk with your dietitian about what   dietary choices are best for you. Grains Whole-grain or whole-wheat bread. Whole-grain or whole-wheat pasta. Brown rice. Oatmeal. Quinoa. Bulgur. Whole-grain and low-sodium cereals. Pita bread. Low-fat, low-sodium crackers. Whole-wheat flour tortillas. Vegetables Fresh or frozen vegetables (raw, steamed, roasted, or grilled). Low-sodium or reduced-sodium tomato and vegetable juice. Low-sodium or reduced-sodium tomato sauce and tomato paste. Low-sodium or reduced-sodium canned vegetables. Fruits All fresh, dried, or frozen fruit. Canned fruit in natural juice (without  added sugar). Meat and other protein foods Skinless chicken or turkey. Ground chicken or turkey. Pork with fat trimmed off. Fish and seafood. Egg whites. Dried beans, peas, or lentils. Unsalted nuts, nut butters, and seeds. Unsalted canned beans. Lean cuts of beef with fat trimmed off. Low-sodium, lean deli meat. Dairy Low-fat (1%) or fat-free (skim) milk. Fat-free, low-fat, or reduced-fat cheeses. Nonfat, low-sodium ricotta or cottage cheese. Low-fat or nonfat yogurt. Low-fat, low-sodium cheese. Fats and oils Soft margarine without trans fats. Vegetable oil. Low-fat, reduced-fat, or light mayonnaise and salad dressings (reduced-sodium). Canola, safflower, olive, soybean, and sunflower oils. Avocado. Seasoning and other foods Herbs. Spices. Seasoning mixes without salt. Unsalted popcorn and pretzels. Fat-free sweets. What foods are not recommended? The items listed may not be a complete list. Talk with your dietitian about what dietary choices are best for you. Grains Baked goods made with fat, such as croissants, muffins, or some breads. Dry pasta or rice meal packs. Vegetables Creamed or fried vegetables. Vegetables in a cheese sauce. Regular canned vegetables (not low-sodium or reduced-sodium). Regular canned tomato sauce and paste (not low-sodium or reduced-sodium). Regular tomato and vegetable juice (not low-sodium or reduced-sodium). Pickles. Olives. Fruits Canned fruit in a light or heavy syrup. Fried fruit. Fruit in cream or butter sauce. Meat and other protein foods Fatty cuts of meat. Ribs. Fried meat. Bacon. Sausage. Bologna and other processed lunch meats. Salami. Fatback. Hotdogs. Bratwurst. Salted nuts and seeds. Canned beans with added salt. Canned or smoked fish. Whole eggs or egg yolks. Chicken or turkey with skin. Dairy Whole or 2% milk, cream, and half-and-half. Whole or full-fat cream cheese. Whole-fat or sweetened yogurt. Full-fat cheese. Nondairy creamers. Whipped toppings.  Processed cheese and cheese spreads. Fats and oils Butter. Stick margarine. Lard. Shortening. Ghee. Bacon fat. Tropical oils, such as coconut, palm kernel, or palm oil. Seasoning and other foods Salted popcorn and pretzels. Onion salt, garlic salt, seasoned salt, table salt, and sea salt. Worcestershire sauce. Tartar sauce. Barbecue sauce. Teriyaki sauce. Soy sauce, including reduced-sodium. Steak sauce. Canned and packaged gravies. Fish sauce. Oyster sauce. Cocktail sauce. Horseradish that you find on the shelf. Ketchup. Mustard. Meat flavorings and tenderizers. Bouillon cubes. Hot sauce and Tabasco sauce. Premade or packaged marinades. Premade or packaged taco seasonings. Relishes. Regular salad dressings. Where to find more information:  National Heart, Lung, and Blood Institute: www.nhlbi.nih.gov  American Heart Association: www.heart.org Summary  The DASH eating plan is a healthy eating plan that has been shown to reduce high blood pressure (hypertension). It may also reduce your risk for type 2 diabetes, heart disease, and stroke.  With the DASH eating plan, you should limit salt (sodium) intake to 2,300 mg a day. If you have hypertension, you may need to reduce your sodium intake to 1,500 mg a day.  When on the DASH eating plan, aim to eat more fresh fruits and vegetables, whole grains, lean proteins, low-fat dairy, and heart-healthy fats.  Work with your health care provider or diet and nutrition specialist (dietitian) to adjust your eating plan to your individual   calorie needs. This information is not intended to replace advice given to you by your health care provider. Make sure you discuss any questions you have with your health care provider. Document Released: 05/22/2011 Document Revised: 05/26/2016 Document Reviewed: 05/26/2016 Elsevier Interactive Patient Education  2018 Elsevier Inc.  

## 2018-05-04 ENCOUNTER — Encounter: Payer: Self-pay | Admitting: Family Medicine

## 2018-05-04 ENCOUNTER — Ambulatory Visit: Payer: 59 | Admitting: Family Medicine

## 2018-05-04 DIAGNOSIS — I10 Essential (primary) hypertension: Secondary | ICD-10-CM | POA: Diagnosis not present

## 2018-05-04 MED ORDER — LISINOPRIL 5 MG PO TABS: 5.0000 mg | ORAL_TABLET | Freq: Every day | ORAL | 0 refills | Status: DC

## 2018-05-04 MED ORDER — AMLODIPINE BESYLATE 5 MG PO TABS: 2.5000 mg | ORAL_TABLET | Freq: Every day | ORAL | 0 refills | Status: DC

## 2018-05-04 NOTE — Progress Notes (Signed)
Patient: Joshua Hendrix Male    DOB: 1991-03-30   27 y.o.   MRN: [id number] Visit Date: 05/04/2018  Today's Provider: Mila Merryonald Ciela Mahajan, MD   Chief Complaint  Patient presents with  . Hypertension   Subjective:    HPI  Hypertension, follow-up:  BP Readings from Last 3 Encounters:  05/04/18 (!) 144/90  03/16/18 135/76  01/06/18 (!) 146/66    He was last seen for hypertension 6 weeks ago.  BP at that visit was 135/76. Management since that visit includes starting Amlodipine 5mg  daily. He reports good compliance with treatment. He is having side effects. Patient reports it makes him foggy, so he started taking 1/2 tablet daily for the past 3 weeks. He is not exercising. He is adherent to low salt diet.   Outside blood pressures are checked occasionally at the pharmacy. He is experiencing none.  Patient denies chest pain, chest pressure/discomfort, claudication, dyspnea, exertional chest pressure/discomfort, fatigue, irregular heart beat, lower extremity edema, near-syncope, orthopnea, palpitations, paroxysmal nocturnal dyspnea and syncope.   Cardiovascular risk factors include hypertension and male gender.  Use of agents associated with hypertension: none.   States he generally avoids salty foods and OTC pain medications.   Weight trend: increasing steadily Wt Readings from Last 3 Encounters:  05/04/18 (!) 380 lb (172.4 kg)  03/16/18 (!) 365 lb (165.6 kg)  01/06/18 (!) 362 lb (164.2 kg)    Current diet: well balanced  ------------------------------------------------------------------------     No Known Allergies   Current Outpatient Medications:  .  amLODipine (NORVASC) 5 MG tablet, Take 0.5 tablets (2.5 mg total) by mouth daily., Disp: 1 tablet, Rfl: 0 .  cetirizine (ZYRTEC) 10 MG chewable tablet, Chew 10 mg by mouth daily., Disp: , Rfl:  .  fexofenadine (ALLEGRA) 180 MG tablet, Take 180 mg by mouth daily., Disp: , Rfl:    Review of Systems    Constitutional: Negative for appetite change, chills and fever.  Respiratory: Negative for chest tightness, shortness of breath and wheezing.   Cardiovascular: Negative for chest pain and palpitations.  Gastrointestinal: Negative for abdominal pain, nausea and vomiting.    Social History   Tobacco Use  . Smoking status: Former Games developermoker  . Smokeless tobacco: Never Used  . Tobacco comment: social smoker only  Substance Use Topics  . Alcohol use: No    Alcohol/week: 0.0 standard drinks   Objective:   BP (!) 144/90 (BP Location: Left Arm, Patient Position: Sitting, Cuff Size: Large)   Pulse 88   Temp 98.6 F (37 C) (Oral)   Resp 16   Wt (!) 380 lb (172.4 kg)   SpO2 98% Comment: room air  BMI 37.86 kg/m  Vitals:   05/04/18 1602 05/04/18 1626  BP: (!) 144/90 (!) 150/90  Pulse: 88   Resp: 16   Temp: 98.6 F (37 C)   TempSrc: Oral   SpO2: 98%   Weight: (!) 380 lb (172.4 kg)      Physical Exam  General appearance: alert, well developed, well nourished, cooperative and in no distress Head: Normocephalic, without obvious abnormality, atraumatic Respiratory: Respirations even and unlabored, normal respiratory rate      Assessment & Plan:     1. Hypertension, unspecified type Did not tolerate 5mg  amlodipine, but tolerating 1/2 x 5mg  tablet a day. Will add low dose lisinopril 5mg  a day. Counseled on potential adverse effects.   Follow up BP check and renal panel in 3 weeks.  .Marland Kitchen  Lelon Huh, MD  [facility]

## 2018-05-11 ENCOUNTER — Other Ambulatory Visit: Payer: Self-pay | Admitting: Family Medicine

## 2018-05-11 DIAGNOSIS — I10 Essential (primary) hypertension: Secondary | ICD-10-CM

## 2018-05-31 ENCOUNTER — Encounter: Payer: Self-pay | Admitting: Family Medicine

## 2018-05-31 ENCOUNTER — Ambulatory Visit: Payer: 59 | Admitting: Family Medicine

## 2018-05-31 VITALS — BP 128/72 | HR 72 | Temp 98.0°F | Resp 16 | Wt 370.0 lb

## 2018-05-31 DIAGNOSIS — Z6836 Body mass index (BMI) 36.0-36.9, adult: Secondary | ICD-10-CM

## 2018-05-31 DIAGNOSIS — I10 Essential (primary) hypertension: Secondary | ICD-10-CM

## 2018-05-31 NOTE — Progress Notes (Signed)
Patient: Joshua Hendrix Male    DOB: 1991/06/10   27 y.o.   MRN: [id number] Visit Date: 05/31/2018  Today's Provider: [name], MD   Chief Complaint  Patient presents with  . Hypertension   Subjective:     HPI  Hypertension, follow-up:  BP Readings from Last 3 Encounters:  05/31/18 128/72  05/04/18 (!) 150/90  03/16/18 135/76    He was last seen for hypertension 3 weeks ago.  BP at that visit was 144/90. Management since that visit includes adding low dose Lisinopril 5mg  daily. Pt also reduce Amlodipine to 2.5mg  a day, secondary to side effects. He reports excellent compliance with treatment. He is not having side effects.  He is exercising. He is adherent to low salt diet.   Outside blood pressures are Pt checks his blood pressure at [facility], he reports it runs high there. He is experiencing none.  Patient denies chest pain, chest pressure/discomfort, fatigue, irregular heart beat, lower extremity edema and palpitations.   Cardiovascular risk factors include none.  Use of agents associated with hypertension: none.     Weight trend: stable Wt Readings from Last 3 Encounters:  05/31/18 (!) 370 lb (167.8 kg)  05/04/18 (!) 380 lb (172.4 kg)  03/16/18 (!) 365 lb (165.6 kg)    Current diet: in general, a "healthy" diet    ------------------------------------------------------------------------   No Known Allergies   Current Outpatient Medications:  .  amLODipine (NORVASC) 5 MG tablet, TAKE 1 TABLET BY MOUTH EVERY DAY (Patient taking differently: Take 2.5 mg by mouth daily. ), Disp: 30 tablet, Rfl: 1 .  lisinopril (PRINIVIL,ZESTRIL) 5 MG tablet, Take 1 tablet (5 mg total) by mouth daily., Disp: 30 tablet, Rfl: 0 .  cetirizine (ZYRTEC) 10 MG chewable tablet, Chew 10 mg by mouth daily., Disp: , Rfl:  .  fexofenadine (ALLEGRA) 180 MG tablet, Take 180 mg by mouth daily., Disp: , Rfl:   Review of Systems  Constitutional: Negative for appetite change,  chills and fever.  Respiratory: Negative for chest tightness, shortness of breath and wheezing.   Cardiovascular: Negative for chest pain and palpitations.  Gastrointestinal: Negative for abdominal pain, nausea and vomiting.    Social History   Tobacco Use  . Smoking status: Former Games developer  . Smokeless tobacco: Never Used  . Tobacco comment: social smoker only  Substance Use Topics  . Alcohol use: No    Alcohol/week: 0.0 standard drinks      Objective:   BP 128/72 (BP Location: Left Arm, Patient Position: Sitting, Cuff Size: Large)   Pulse 72   Temp 98 F (36.7 C) (Oral)   Resp 16   Wt (!) 370 lb (167.8 kg)   BMI 36.87 kg/m    Physical Exam  General appearance: alert, well developed, well nourished, cooperative and in no distress Head: Normocephalic, without obvious abnormality, atraumatic Respiratory: Respirations even and unlabored, normal respiratory rate Extremities: No gross deformities Skin: Skin color, texture, turgor normal. No rashes seen  Psych: Appropriate mood and affect. Neurologic: Mental status: Alert, oriented to person, place, and time, thought content appropriate.     Assessment & Plan    1. Hypertension, unspecified type Doing well with lower dose of amlodipine with addition of lisinopril. Working on weight loss, exercising three times a week. - Renal function panel  2. Class 2 severe obesity with serious comorbidity and body mass index (BMI) of 36.0 to 36.9 in adult, unspecified obesity type (HCC) Counseled regarding prudent diet  and regular exercise and is noted to have lost 10 pounds in the last month. Continue diet and exercise to lose 4-5 pounds a month to get BMI under 30.      Mila Merryonald Jessy Calixte, MD  [facility]

## 2018-05-31 NOTE — Patient Instructions (Signed)
DASH Eating Plan DASH stands for "Dietary Approaches to Stop Hypertension." The DASH eating plan is a healthy eating plan that has been shown to reduce high blood pressure (hypertension). It may also reduce your risk for type 2 diabetes, heart disease, and stroke. The DASH eating plan may also help with weight loss. What are tips for following this plan? General guidelines  Avoid eating more than 2,300 mg (milligrams) of salt (sodium) a day. If you have hypertension, you may need to reduce your sodium intake to 1,500 mg a day.  Limit alcohol intake to no more than 1 drink a day for nonpregnant women and 2 drinks a day for men. One drink equals 12 oz of beer, 5 oz of wine, or 1 oz of hard liquor.  Work with your health care provider to maintain a healthy body weight or to lose weight. Ask what an ideal weight is for you.  Get at least 30 minutes of exercise that causes your heart to beat faster (aerobic exercise) most days of the week. Activities may include walking, swimming, or biking.  Work with your health care provider or diet and nutrition specialist (dietitian) to adjust your eating plan to your individual calorie needs. Reading food labels  Check food labels for the amount of sodium per serving. Choose foods with less than 5 percent of the Daily Value of sodium. Generally, foods with less than 300 mg of sodium per serving fit into this eating plan.  To find whole grains, look for the word "whole" as the first word in the ingredient list. Shopping  Buy products labeled as "low-sodium" or "no salt added."  Buy fresh foods. Avoid canned foods and premade or frozen meals. Cooking  Avoid adding salt when cooking. Use salt-free seasonings or herbs instead of table salt or sea salt. Check with your health care provider or pharmacist before using salt substitutes.  Do not fry foods. Cook foods using healthy methods such as baking, boiling, grilling, and broiling instead.  Cook with  heart-healthy oils, such as olive, canola, soybean, or sunflower oil. Meal planning   Eat a balanced diet that includes: ? 5 or more servings of fruits and vegetables each day. At each meal, try to fill half of your plate with fruits and vegetables. ? Up to 6-8 servings of whole grains each day. ? Less than 6 oz of lean meat, poultry, or fish each day. A 3-oz serving of meat is about the same size as a deck of cards. One egg equals 1 oz. ? 2 servings of low-fat dairy each day. ? A serving of nuts, seeds, or beans 5 times each week. ? Heart-healthy fats. Healthy fats called Omega-3 fatty acids are found in foods such as flaxseeds and coldwater fish, like sardines, salmon, and mackerel.  Limit how much you eat of the following: ? Canned or prepackaged foods. ? Food that is high in trans fat, such as fried foods. ? Food that is high in saturated fat, such as fatty meat. ? Sweets, desserts, sugary drinks, and other foods with added sugar. ? Full-fat dairy products.  Do not salt foods before eating.  Try to eat at least 2 vegetarian meals each week.  Eat more home-cooked food and less restaurant, buffet, and fast food.  When eating at a restaurant, ask that your food be prepared with less salt or no salt, if possible. What foods are recommended? The items listed may not be a complete list. Talk with your dietitian about what   dietary choices are best for you. Grains Whole-grain or whole-wheat bread. Whole-grain or whole-wheat pasta. Brown rice. Oatmeal. Quinoa. Bulgur. Whole-grain and low-sodium cereals. Pita bread. Low-fat, low-sodium crackers. Whole-wheat flour tortillas. Vegetables Fresh or frozen vegetables (raw, steamed, roasted, or grilled). Low-sodium or reduced-sodium tomato and vegetable juice. Low-sodium or reduced-sodium tomato sauce and tomato paste. Low-sodium or reduced-sodium canned vegetables. Fruits All fresh, dried, or frozen fruit. Canned fruit in natural juice (without  added sugar). Meat and other protein foods Skinless chicken or turkey. Ground chicken or turkey. Pork with fat trimmed off. Fish and seafood. Egg whites. Dried beans, peas, or lentils. Unsalted nuts, nut butters, and seeds. Unsalted canned beans. Lean cuts of beef with fat trimmed off. Low-sodium, lean deli meat. Dairy Low-fat (1%) or fat-free (skim) milk. Fat-free, low-fat, or reduced-fat cheeses. Nonfat, low-sodium ricotta or cottage cheese. Low-fat or nonfat yogurt. Low-fat, low-sodium cheese. Fats and oils Soft margarine without trans fats. Vegetable oil. Low-fat, reduced-fat, or light mayonnaise and salad dressings (reduced-sodium). Canola, safflower, olive, soybean, and sunflower oils. Avocado. Seasoning and other foods Herbs. Spices. Seasoning mixes without salt. Unsalted popcorn and pretzels. Fat-free sweets. What foods are not recommended? The items listed may not be a complete list. Talk with your dietitian about what dietary choices are best for you. Grains Baked goods made with fat, such as croissants, muffins, or some breads. Dry pasta or rice meal packs. Vegetables Creamed or fried vegetables. Vegetables in a cheese sauce. Regular canned vegetables (not low-sodium or reduced-sodium). Regular canned tomato sauce and paste (not low-sodium or reduced-sodium). Regular tomato and vegetable juice (not low-sodium or reduced-sodium). Pickles. Olives. Fruits Canned fruit in a light or heavy syrup. Fried fruit. Fruit in cream or butter sauce. Meat and other protein foods Fatty cuts of meat. Ribs. Fried meat. Bacon. Sausage. Bologna and other processed lunch meats. Salami. Fatback. Hotdogs. Bratwurst. Salted nuts and seeds. Canned beans with added salt. Canned or smoked fish. Whole eggs or egg yolks. Chicken or turkey with skin. Dairy Whole or 2% milk, cream, and half-and-half. Whole or full-fat cream cheese. Whole-fat or sweetened yogurt. Full-fat cheese. Nondairy creamers. Whipped toppings.  Processed cheese and cheese spreads. Fats and oils Butter. Stick margarine. Lard. Shortening. Ghee. Bacon fat. Tropical oils, such as coconut, palm kernel, or palm oil. Seasoning and other foods Salted popcorn and pretzels. Onion salt, garlic salt, seasoned salt, table salt, and sea salt. Worcestershire sauce. Tartar sauce. Barbecue sauce. Teriyaki sauce. Soy sauce, including reduced-sodium. Steak sauce. Canned and packaged gravies. Fish sauce. Oyster sauce. Cocktail sauce. Horseradish that you find on the shelf. Ketchup. Mustard. Meat flavorings and tenderizers. Bouillon cubes. Hot sauce and Tabasco sauce. Premade or packaged marinades. Premade or packaged taco seasonings. Relishes. Regular salad dressings. Where to find more information:  National Heart, Lung, and Blood Institute: www.nhlbi.nih.gov  American Heart Association: www.heart.org Summary  The DASH eating plan is a healthy eating plan that has been shown to reduce high blood pressure (hypertension). It may also reduce your risk for type 2 diabetes, heart disease, and stroke.  With the DASH eating plan, you should limit salt (sodium) intake to 2,300 mg a day. If you have hypertension, you may need to reduce your sodium intake to 1,500 mg a day.  When on the DASH eating plan, aim to eat more fresh fruits and vegetables, whole grains, lean proteins, low-fat dairy, and heart-healthy fats.  Work with your health care provider or diet and nutrition specialist (dietitian) to adjust your eating plan to your individual   calorie needs. This information is not intended to replace advice given to you by your health care provider. Make sure you discuss any questions you have with your health care provider. Document Released: 05/22/2011 Document Revised: 05/26/2016 Document Reviewed: 05/26/2016 Elsevier Interactive Patient Education  2018 Elsevier Inc.  

## 2018-06-01 ENCOUNTER — Other Ambulatory Visit: Payer: Self-pay | Admitting: Family Medicine

## 2018-06-01 ENCOUNTER — Telehealth: Payer: Self-pay

## 2018-06-01 DIAGNOSIS — I10 Essential (primary) hypertension: Secondary | ICD-10-CM

## 2018-06-01 LAB — RENAL FUNCTION PANEL
Albumin: 4.3 g/dL (ref 3.5–5.5)
BUN/Creatinine Ratio: 15 (ref 9–20)
BUN: 14 mg/dL (ref 6–20)
CHLORIDE: 102 mmol/L (ref 96–106)
CO2: 22 mmol/L (ref 20–29)
Calcium: 9.1 mg/dL (ref 8.7–10.2)
Creatinine, Ser: 0.96 mg/dL (ref 0.76–1.27)
GFR, EST AFRICAN AMERICAN: 126 mL/min/{1.73_m2} (ref >59)
GFR, EST NON AFRICAN AMERICAN: 109 mL/min/{1.73_m2} (ref >59)
Glucose: 94 mg/dL (ref 65–99)
PHOSPHORUS: 3.3 mg/dL (ref 2.5–4.5)
Potassium: 4.4 mmol/L (ref 3.5–5.2)
Sodium: 137 mmol/L (ref 134–144)

## 2018-06-01 MED ORDER — LISINOPRIL 5 MG PO TABS: 5.0000 mg | ORAL_TABLET | Freq: Every day | ORAL | 5 refills | Status: DC

## 2018-06-01 MED ORDER — AMLODIPINE BESYLATE 2.5 MG PO TABS: 2.5000 mg | ORAL_TABLET | Freq: Every day | ORAL | 5 refills | Status: DC

## 2018-06-01 NOTE — Telephone Encounter (Signed)
-----   Message from Malva Limesonald E Fisher, MD sent at 06/01/2018  7:58 AM EST ----- Labs good. Continue current dose of amlodipine and lisinopril. Will send in prescription for 2.5mg  amlodipine to take when the 5mg  tablets run out.

## 2018-06-01 NOTE — Telephone Encounter (Signed)
LMTCB 06/01/2018  Thanks,   -Laura  

## 2018-06-01 NOTE — Telephone Encounter (Signed)
Pt advised.   Thanks,   -Tylisa Alcivar  

## 2018-11-24 ENCOUNTER — Encounter: Payer: Self-pay | Admitting: Family Medicine

## 2018-11-24 ENCOUNTER — Ambulatory Visit: Payer: 59 | Admitting: Family Medicine

## 2018-11-24 ENCOUNTER — Other Ambulatory Visit: Payer: Self-pay

## 2018-11-24 VITALS — BP 134/68 | HR 72 | Temp 98.5°F | Resp 16 | Wt 366.0 lb

## 2018-11-24 DIAGNOSIS — W57XXXA Bitten or stung by nonvenomous insect and other nonvenomous arthropods, initial encounter: Secondary | ICD-10-CM | POA: Diagnosis not present

## 2018-11-24 DIAGNOSIS — I10 Essential (primary) hypertension: Secondary | ICD-10-CM | POA: Diagnosis not present

## 2018-11-24 MED ORDER — LISINOPRIL 5 MG PO TABS: 5.0000 mg | ORAL_TABLET | Freq: Every day | ORAL | 4 refills | Status: DC

## 2018-11-24 MED ORDER — AMLODIPINE BESYLATE 2.5 MG PO TABS: 2.5000 mg | ORAL_TABLET | Freq: Every day | ORAL | 4 refills | Status: DC

## 2018-11-24 NOTE — Progress Notes (Signed)
Patient: Joshua Hendrix Male    DOB: Dec 25, 1990   28 y.o.   MRN: [id number] Visit Date: 11/24/2018  Today's Provider: Mila Merryonald Magdalene Tardiff, MD   Chief Complaint  Patient presents with  . Hypertension  . Insect Bite   Subjective:     HPI    Follow up for Tick bite  The patient was last seen for this 2 weeks ago. [facility].  Pt was prescribed Doxycycline 100mg  2 times a day for 10 days.  Pt reports he as completed the entire course of the antibiotic. He states the bite has improved but is not completely gone.   He reports excellent compliance with treatment. He feels that condition is Improved.  He states the bite has improved but is not completely gone. He is not having side effects.   ------------------------------------------------------------------------------------      Hypertension, follow-up:  BP Readings from Last 3 Encounters:  11/24/18 134/68  05/31/18 128/72  05/04/18 (!) 150/90    He was last seen for hypertension 6 months ago.  BP at that visit was 128/72. Management since that visit includes working on lifestyle changes. He reports excellent compliance with treatment. He is not having side effects.  He is exercising. He is adherent to low salt diet.   Outside blood pressures are not being checked. He is experiencing none.  Patient denies chest pain, fatigue, lower extremity edema and palpitations.   Cardiovascular risk factors include hypertension and male gender.  Use of agents associated with hypertension: none.     Weight trend: stable Wt Readings from Last 3 Encounters:  11/24/18 (!) 366 lb (166 kg)  05/31/18 (!) 370 lb (167.8 kg)  05/04/18 (!) 380 lb (172.4 kg)    Current diet: in general, a "healthy" diet    ------------------------------------------------------------------------    No Known Allergies   Current Outpatient Medications:  .  amLODipine (NORVASC) 2.5 MG tablet, Take 1 tablet (2.5 mg total)  by mouth daily., Disp: 30 tablet, Rfl: 5 .  cetirizine (ZYRTEC) 10 MG chewable tablet, Chew 10 mg by mouth daily., Disp: , Rfl:  .  fexofenadine (ALLEGRA) 180 MG tablet, Take 180 mg by mouth daily., Disp: , Rfl:  .  lisinopril (PRINIVIL,ZESTRIL) 5 MG tablet, Take 1 tablet (5 mg total) by mouth daily., Disp: 30 tablet, Rfl: 5  Review of Systems  Constitutional: Negative.   Respiratory: Negative.   Cardiovascular: Negative.   Gastrointestinal: Negative.   Musculoskeletal: Negative.   Skin: Negative.   Neurological: Negative for dizziness, light-headedness and headaches.    Social History   Tobacco Use  . Smoking status: Former Games developermoker  . Smokeless tobacco: Never Used  . Tobacco comment: social smoker only  Substance Use Topics  . Alcohol use: No    Alcohol/week: 0.0 standard drinks      Objective:   BP 134/68 (BP Location: Right Arm, Patient Position: Sitting, Cuff Size: Large)   Pulse 72   Temp 98.5 F (36.9 C) (Oral)   Resp 16   Wt (!) 366 lb (166 kg)   BMI 36.47 kg/m  Vitals:   11/24/18 0929  BP: 134/68  Pulse: 72  Resp: 16  Temp: 98.5 F (36.9 C)  TempSrc: Oral  Weight: (!) 366 lb (166 kg)     Physical Exam   General Appearance:    Alert, cooperative, no distress  Eyes:    PERRL, conjunctiva/corneas clear, EOM's intact  Lungs:     Clear to auscultation bilaterally, respirations unlabored  Heart:    Regular rate and rhythm  Skin:   Faint fading bullseye rash right mid back         Assessment & Plan    1. Hypertension, unspecified type Well controlled.  Continue current medications.   - lisinopril (ZESTRIL) 5 MG tablet; Take 1 tablet (5 mg total) by mouth daily.  Dispense: 90 tablet; Refill: 4 - amLODipine (NORVASC) 2.5 MG tablet; Take 1 tablet (2.5 mg total) by mouth daily.  Dispense: 90 tablet; Refill: 4  2. Tick bite, initial encounter Has finished 10 day course of doxycycline prescribed at Urgent care. No systemic symptoms. Rash is fading.  Call if any new or worsening sx.     The entirety of the information documented in the History of Present Illness, Review of Systems and Physical Exam were personally obtained by me. Portions of this information were initially documented by Ashley Royalty, CMA and reviewed by me for thoroughness and accuracy.   Lelon Huh, MD  [facility]

## 2018-11-24 NOTE — Patient Instructions (Signed)
.   Please review the attached list of medications and notify my office if there are any errors.   . Please bring all of your medications to every appointment so we can make sure that our medication list is the same as yours.   

## 2019-05-02 ENCOUNTER — Other Ambulatory Visit: Payer: Self-pay

## 2019-05-02 DIAGNOSIS — Z20822 Contact with and (suspected) exposure to covid-19: Secondary | ICD-10-CM

## 2019-05-03 LAB — NOVEL CORONAVIRUS, NAA: SARS-CoV-2, NAA: NOT DETECTED

## 2019-05-03 LAB — INPATIENT

## 2019-06-20 ENCOUNTER — Ambulatory Visit: Payer: 59 | Admitting: Family Medicine

## 2019-06-20 ENCOUNTER — Encounter: Payer: Self-pay | Admitting: Family Medicine

## 2019-06-20 ENCOUNTER — Other Ambulatory Visit: Payer: Self-pay

## 2019-06-20 VITALS — BP 122/70 | HR 68 | Resp 16 | Ht >= 80 in | Wt 369.0 lb

## 2019-06-20 DIAGNOSIS — Z6836 Body mass index (BMI) 36.0-36.9, adult: Secondary | ICD-10-CM | POA: Diagnosis not present

## 2019-06-20 DIAGNOSIS — I10 Essential (primary) hypertension: Secondary | ICD-10-CM | POA: Diagnosis not present

## 2019-06-20 DIAGNOSIS — Z23 Encounter for immunization: Secondary | ICD-10-CM

## 2019-06-20 NOTE — Progress Notes (Signed)
Patient: Joshua Hendrix Male    DOB: 08/28/1990   29 y.o.   MRN: [id number] Visit Date: 06/20/2019  Today's Provider: [name], MD   Chief Complaint  Patient presents with  . Hypertension   Subjective:     HPI  Hypertension, follow-up:  BP Readings from Last 3 Encounters:  06/20/19 122/70  11/24/18 134/68  05/31/18 128/72    He was last seen for hypertension 6 months ago.  BP at that visit was 134/68. Management since that visit includes no changes. He reports good compliance with treatment. He is not having side effects.  He is exercising. He is adherent to low salt diet.   Outside blood pressures are checked occasionally. He is experiencing none.  Patient denies chest pain, chest pressure/discomfort, claudication, dyspnea, exertional chest pressure/discomfort, fatigue, irregular heart beat, near-syncope, orthopnea, palpitations, paroxysmal nocturnal dyspnea, syncope and tachypnea.   Cardiovascular risk factors include hypertension.  Use of agents associated with hypertension: none.     Weight trend: fluctuating a bit Wt Readings from Last 3 Encounters:  06/20/19 (!) 369 lb (167.4 kg)  11/24/18 (!) 366 lb (166 kg)  05/31/18 (!) 370 lb (167.8 kg)    Current diet: well balanced  ------------------------------------------------------------------------  No Known Allergies   Current Outpatient Medications:  .  amLODipine (NORVASC) 2.5 MG tablet, Take 1 tablet (2.5 mg total) by mouth daily., Disp: 90 tablet, Rfl: 4 .  cetirizine (ZYRTEC) 10 MG chewable tablet, Chew 10 mg by mouth daily., Disp: , Rfl:  .  fexofenadine (ALLEGRA) 180 MG tablet, Take 180 mg by mouth daily., Disp: , Rfl:  .  lisinopril (ZESTRIL) 5 MG tablet, Take 1 tablet (5 mg total) by mouth daily., Disp: 90 tablet, Rfl: 4  Review of Systems  Constitutional: Negative for appetite change, chills and fever.  Respiratory: Negative for chest tightness, shortness of breath and wheezing.     Cardiovascular: Negative for chest pain and palpitations.  Gastrointestinal: Negative for abdominal pain, nausea and vomiting.    Social History   Tobacco Use  . Smoking status: Former Games developer  . Smokeless tobacco: Never Used  . Tobacco comment: social smoker only  Substance Use Topics  . Alcohol use: No    Alcohol/week: 0.0 standard drinks      Objective:   BP 122/70 (BP Location: Right Arm, Patient Position: Sitting, Cuff Size: Large)   Pulse 68   Resp 16   Ht 7' (2.134 m)   Wt (!) 369 lb (167.4 kg)   SpO2 98% Comment: room air  BMI 36.77 kg/m  Vitals:   06/20/19 0834  BP: 122/70  Pulse: 68  Resp: 16  SpO2: 98%  Weight: (!) 369 lb (167.4 kg)  Height: 7' (2.134 m)  Body mass index is 36.77 kg/m.   Physical Exam   General Appearance:    Obese male in no acute distress  Eyes:    PERRL, conjunctiva/corneas clear, EOM's intact       Lungs:     Clear to auscultation bilaterally, respirations unlabored  Heart:    Normal heart rate. Normal rhythm. No murmurs, rubs, or gallops.   MS:   All extremities are intact.   Neurologic:   Awake, alert, oriented x 3. No apparent focal neurological           defect.            Assessment & Plan    1. Hypertension, unspecified type Very well controlled. Continue current  medications.   - Comprehensive metabolic panel  2. Class 2 severe obesity with serious comorbidity and body mass index (BMI) of 36.0 to 36.9 in adult, unspecified obesity type ([place]) Counseled regarding prudent diet and regular exercise.   3. Need for influenza vaccination  - Flu Vaccine QUAD 6+ mos PF IM (Fluarix Quad PF)  The entirety of the information documented in the History of Present Illness, Review of Systems and Physical Exam were personally obtained by me. Portions of this information were initially documented by Meyer Cory, CMA and reviewed by me for thoroughness and accuracy.      Lelon Huh, MD  [facility]

## 2019-06-20 NOTE — Patient Instructions (Signed)
.   Please review the attached list of medications and notify my office if there are any errors.   . Please bring all of your medications to every appointment so we can make sure that our medication list is the same as yours.   

## 2019-06-21 ENCOUNTER — Telehealth: Payer: Self-pay

## 2019-06-21 LAB — COMPREHENSIVE METABOLIC PANEL
ALT: 44 IU/L (ref 0–44)
AST: 29 IU/L (ref 0–40)
Albumin/Globulin Ratio: 2.2 (ref 1.2–2.2)
Albumin: 4.4 g/dL (ref 4.1–5.2)
Alkaline Phosphatase: 66 IU/L (ref 39–117)
BUN/Creatinine Ratio: 8 — ABNORMAL LOW (ref 9–20)
BUN: 8 mg/dL (ref 6–20)
Bilirubin Total: 0.3 mg/dL (ref 0.0–1.2)
CO2: 23 mmol/L (ref 20–29)
Calcium: 9.2 mg/dL (ref 8.7–10.2)
Chloride: 105 mmol/L (ref 96–106)
Creatinine, Ser: 1 mg/dL (ref 0.76–1.27)
GFR calc Af Amer: 119 mL/min/{1.73_m2} (ref >59)
GFR calc non Af Amer: 103 mL/min/{1.73_m2} (ref >59)
Globulin, Total: 2 g/dL (ref 1.5–4.5)
Glucose: 101 mg/dL — ABNORMAL HIGH (ref 65–99)
Potassium: 4.3 mmol/L (ref 3.5–5.2)
Sodium: 141 mmol/L (ref 134–144)
Total Protein: 6.4 g/dL (ref 6.0–8.5)

## 2019-06-21 NOTE — Telephone Encounter (Signed)
-----   Message from Malva Limes, MD sent at 06/21/2019  8:04 AM EST ----- Kidney functions, electrolytes, sugar and liver functions are all good. Continue same blood pressure medications. Follow up in 1 year.

## 2019-06-21 NOTE — Telephone Encounter (Signed)
Left patient a message advising as stated below.  

## 2020-02-09 ENCOUNTER — Telehealth: Payer: Self-pay

## 2020-02-09 NOTE — Telephone Encounter (Signed)
Copied from CRM [id number]. Topic: General - Other >> Feb 09, 2020  2:00 PM Tamela Oddi wrote: Reason for CRM: Patient would like a call from the nurse regarding whether he should see the doctor or get a referral for a surgeon.  Please call patient to discuss at [phone]

## 2020-02-09 NOTE — Telephone Encounter (Signed)
Patient states he has a painful cyst on his right foot that needs to be removed. He says he has a past history of similar cyst on his back that Dr. Evette Cristal had to remove.   Patient would like a referral to a surgeon since Dr. Evette Cristal has retired. Patient has not seen Dr. Sherrie Mustache for this in the past. I advised patient that he needs an office visit before we could place an order for a referral. Appointment scheduled for tomorrow at 3:20pm.   COVID screen negative

## 2020-02-10 ENCOUNTER — Ambulatory Visit: Payer: 59 | Admitting: Family Medicine

## 2020-02-10 ENCOUNTER — Encounter: Payer: Self-pay | Admitting: Family Medicine

## 2020-02-10 ENCOUNTER — Other Ambulatory Visit: Payer: Self-pay

## 2020-02-10 VITALS — BP 146/76 | HR 81 | Temp 98.1°F | Resp 20 | Wt 370.0 lb

## 2020-02-10 DIAGNOSIS — L723 Sebaceous cyst: Secondary | ICD-10-CM | POA: Diagnosis not present

## 2020-02-10 DIAGNOSIS — L089 Local infection of the skin and subcutaneous tissue, unspecified: Secondary | ICD-10-CM

## 2020-02-10 MED ORDER — CEPHALEXIN 500 MG PO CAPS: 500.0000 mg | ORAL_CAPSULE | Freq: Four times a day (QID) | ORAL | 0 refills | Status: AC

## 2020-02-10 NOTE — Progress Notes (Signed)
     I,[name],acting as a scribe for [name], MD.,have documented all relevant documentation on the behalf of [name], MD,as directed by  [name], MD while in the presence of [name], MD.  Established patient visit   Patient: Joshua Hendrix   DOB: [birth year]   29 y.o. Male  MRN: [id number] Visit Date: 02/10/2020  Today's healthcare provider: [name], MD   Chief Complaint  Patient presents with  . Cyst   Subjective    HPI  Possible cyst: Patient complains of a painful cyst on lateral side of his right foot. Cyst appeared 2-3 weeks ago and has increased in size. He rates pain level 3:10. He reports having a past history of similar cysts on his back which he had removed by su[place] Dr. Evette Cristal   Patient is requesting a referral to a surgeon.     Medications: Outpatient Medications Prior to Visit  Medication Sig  . amLODipine (NORVASC) 2.5 MG tablet Take 1 tablet (2.5 mg total) by mouth daily.  . cetirizine (ZYRTEC) 10 MG chewable tablet Chew 10 mg by mouth daily.  . fexofenadine (ALLEGRA) 180 MG tablet Take 180 mg by mouth daily.  Marland Kitchen lisinopril (ZESTRIL) 5 MG tablet Take 1 tablet (5 mg total) by mouth daily.   No facility-administered medications prior to visit.    Review of Systems  Constitutional: Negative for appetite change, chills and fever.  Respiratory: Negative for chest tightness, shortness of breath and wheezing.   Cardiovascular: Negative for chest pain and palpitations.  Gastrointestinal: Negative for abdominal pain, nausea and vomiting.  Musculoskeletal: Positive for joint swelling (right foot).  Skin: Positive for color change (redness on side of right foot).       Mass on right foot      Objective    BP (!) 146/76 (BP Location: Left Arm, Cuff Size: Large)   Pulse 81   Temp 98.1 F (36.7 C) (Oral)   Resp 20   Wt (!) 370 lb (167.8 kg)   BMI 36.87 kg/m    Physical Exam  Right lateral foot. Mass tender to touch.       No results found for any visits on 02/10/20.  Assessment & Plan     1. Infected sebaceous cyst of foot  - cephALEXin (KEFLEX) 500 MG capsule; Take 1 capsule (500 mg total) by mouth 4 (four) times daily for 10 days.  Dispense: 40 capsule; Refill: 0  He states he knows his body and antibiotic never completely resolves, he wants refill to have it excised.  - Ambulatory referral to Podiatry   No follow-ups on file.         [name], MD  [facility] [phone] (phone) [phone] (fax)  [facility]

## 2020-02-18 ENCOUNTER — Other Ambulatory Visit: Payer: Self-pay | Admitting: Family Medicine

## 2020-02-18 DIAGNOSIS — I10 Essential (primary) hypertension: Secondary | ICD-10-CM

## 2020-02-18 NOTE — Telephone Encounter (Signed)
Requested medications are due for refill today?  Yes  Requested medications are on active medication list?  Yes  Last Refill:  11/24/2018  # 90 with 4 refills  Future visit scheduled?  No   Notes to Clinic:  Medication failed Rx refill protocol due to no labs within the past 180 days.

## 2020-02-18 NOTE — Telephone Encounter (Signed)
Requested Prescriptions  Pending Prescriptions Disp Refills   lisinopril (ZESTRIL) 5 MG tablet [Pharmacy Med Name: LISINOPRIL 5 MG TABLET] 90 tablet 4    Sig: TAKE 1 TABLET BY MOUTH EVERY DAY     Cardiovascular:  ACE Inhibitors Failed - 02/18/2020 12:41 AM      Failed - Cr in normal range and within 180 days    Creatinine, Ser  Date Value Ref Range Status  06/20/2019 1.00 0.76 - 1.27 mg/dL Final         Failed - K in normal range and within 180 days    Potassium  Date Value Ref Range Status  06/20/2019 4.3 3.5 - 5.2 mmol/L Final         Failed - Last BP in normal range    BP Readings from Last 1 Encounters:  02/10/20 (!) 146/76         Passed - Patient is not pregnant      Passed - Valid encounter within last 6 months    Recent Outpatient Visits          1 week ago Infected sebaceous cyst of foot   [facility] Malva Limes, MD   8 months ago Hypertension, unspecified type   [place] Malva Limes, MD   1 year ago Hypertension, unspecified type   [facility] Malva Limes, MD   1 year ago Hypertension, unspecified type   [facility] Malva Limes, MD   1 year ago Hypertension, unspecified type   [facility] Malva Limes, MD              amLODipine (NORVASC) 2.5 MG tablet [Pharmacy Med Name: AMLODIPINE BESYLATE 2.5 MG TAB] 90 tablet 1    Sig: TAKE 1 TABLET BY MOUTH EVERY DAY     Cardiovascular:  Calcium Channel Blockers Failed - 02/18/2020 12:41 AM      Failed - Last BP in normal range    BP Readings from Last 1 Encounters:  02/10/20 (!) 146/76         Passed - Valid encounter within last 6 months    Recent Outpatient Visits          1 week ago Infected sebaceous cyst of foot   [facility] Malva Limes, MD   8 months ago Hypertension, unspecified type   [facility] Malva Limes, MD   1 year ago Hypertension, unspecified type    [facility] Malva Limes, MD   1 year ago Hypertension, unspecified type   [facility] Malva Limes, MD   1 year ago Hypertension, unspecified type   [facility] Malva Limes, MD

## 2020-03-02 ENCOUNTER — Ambulatory Visit (INDEPENDENT_AMBULATORY_CARE_PROVIDER_SITE_OTHER): Payer: 59

## 2020-03-02 ENCOUNTER — Other Ambulatory Visit: Payer: Self-pay

## 2020-03-02 ENCOUNTER — Ambulatory Visit: Payer: 59 | Admitting: Podiatry

## 2020-03-02 DIAGNOSIS — M67471 Ganglion, right ankle and foot: Secondary | ICD-10-CM

## 2020-03-02 DIAGNOSIS — M7989 Other specified soft tissue disorders: Secondary | ICD-10-CM | POA: Diagnosis not present

## 2020-03-02 NOTE — Patient Instructions (Signed)
Pre-Operative Instructions  Congratulations, you have decided to take an important step towards improving your quality of life.  You can be assured that the doctors and staff at [facility] will be with you every step of the way.  Here are some important things you should know:  1. Plan to be at the surgery center/hospital at least 1 (one) hour prior to your scheduled time, unless otherwise directed by the surgical center/hospital staff.  You must have a responsible adult accompany you, remain during the surgery and drive you home.  Make sure you have directions to the surgical center/hospital to ensure you arrive on time. 2. If you are having surgery at [facility] or [facility], you will need a copy of your medical history and physical form from your family physician within one month prior to the date of surgery. We will give you a form for your primary physician to complete.  3. We make every effort to accommodate the date you request for surgery.  However, there are times where surgery dates or times have to be moved.  We will contact you as soon as possible if a change in schedule is required.   4. No aspirin/ibuprofen for one week before surgery.  If you are on aspirin, any non-steroidal anti-inflammatory medications (Mobic, Aleve, Ibuprofen) should not be taken seven (7) days prior to your surgery.  You make take Tylenol for pain prior to surgery.  5. Medications - If you are taking daily heart and blood pressure medications, seizure, reflux, allergy, asthma, anxiety, pain or diabetes medications, make sure you notify the surgery center/hospital before the day of surgery so they can tell you which medications you should take or avoid the day of surgery. 6. No food or drink after midnight the night before surgery unless directed otherwise by surgical center/hospital staff. 7. No alcoholic beverages 24-hours prior to surgery.  No smoking 24-hours prior or 24-hours after  surgery. 8. Wear loose pants or shorts. They should be loose enough to fit over bandages, boots, and casts. 9. Don't wear slip-on shoes. Sneakers are preferred. 10. Bring your boot with you to the surgery center/hospital.  Also bring crutches or a walker if your physician has prescribed it for you.  If you do not have this equipment, it will be provided for you after surgery. 11. If you have not been contacted by the surgery center/hospital by the day before your surgery, call to confirm the date and time of your surgery. 12. Leave-time from work may vary depending on the type of surgery you have.  Appropriate arrangements should be made prior to surgery with your employer. 13. Prescriptions will be provided immediately following surgery by your doctor.  Fill these as soon as possible after surgery and take the medication as directed. Pain medications will not be refilled on weekends and must be approved by the doctor. 14. Remove nail polish on the operative foot and avoid getting pedicures prior to surgery. 15. Wash the night before surgery.  The night before surgery wash the foot and leg well with water and the antibacterial soap provided. Be sure to pay special attention to beneath the toenails and in between the toes.  Wash for at least three (3) minutes. Rinse thoroughly with water and dry well with a towel.  Perform this wash unless told not to do so by your physician.  Enclosed: 1 Ice pack (please put in freezer the night before surgery)   1 Hibiclens skin cleaner     Pre-op instructions  If you have any questions regarding the instructions, please do not hesitate to call our office.  [place]: [address], [place], [place] [address] -- [phone]  [place]: [address] [place], [place] [address] -- [phone]  [place]: [address], [place],  [address] -- [phone]   Website: [email or web address] 

## 2020-03-07 ENCOUNTER — Telehealth: Payer: Self-pay

## 2020-03-07 NOTE — Telephone Encounter (Signed)
DOS 03/15/2020  EXC GANGLION/TUMOR RT - 19379  [employer] EFFECTIVE DATE - 07/18/2019  PLAN DEDUCTIBLE - $2500.00 W/ $2403.16 REMAINING OUT OF POCKET - $5000.00 W/ $4758.81 REMAINING COPAY $0.00 COINSURANCE - 20%  DONE UNDER OUTPATIENT FACILITY Tracking #: [id number]  Message: [id number]: Procedures performed at an ambulatory surgery center do not require prior authorization  DONE UNDER AMBULATORY SURGICAL CENTER Notification or Prior Authorization is not required for the requested services  This [employer] member's plan does not currently require a prior authorization for these services. If you have general questions about the prior authorization requirements, please call us at [phone] or visit [email or web address] > Clinician Resources > Advance and Admission Notification Requirements. The number above acknowledges your notification. Please write this number down for future reference. Notification is not a guarantee of coverage or payment.  Decision ID #:[id number]

## 2020-03-09 NOTE — Progress Notes (Signed)
   HPI: 29 y.o. male presenting today as a new patient for evaluation of a lesion that is developed to the patient's lateral aspect of the right foot.  This been ongoing for approximately 3-4 months now.  Patient has noticed a lump to the lateral aspect of the right foot/ankle developed over the last 3-4 months.  He feels pressure to the area and it is painful by the end of the day.  Painful in shoes.  His PCP prescribed him antibiotic cephalexin which did not improve his symptoms.  He presents for further treatment and evaluation  Past Medical History:  Diagnosis Date  . Amblyopia      Physical Exam: General: The patient is alert and oriented x3 in no acute distress.  Dermatology: Skin is warm, dry and supple bilateral lower extremities. Negative for open lesions or macerations.  Vascular: Palpable pedal pulses bilaterally. No edema or erythema noted. Capillary refill within normal limits.  Neurological: Epicritic and protective threshold grossly intact bilaterally.   Musculoskeletal Exam: Range of motion within normal limits to all pedal and ankle joints bilateral. Muscle strength 5/5 in all groups bilateral.  There is a palpable nonadhered hard lesion approximately 1 cm in diameter noted to the lateral aspect of the right foot.  Possibly consistent with a fibroma or fibrous mass.  There is some tenderness to palpation also noted as well.  Radiographic Exam:  Normal osseous mineralization. Joint spaces preserved. No fracture/dislocation/boney destruction.    Assessment: 1.  Likely benign soft tissue mass right foot   Plan of Care:  1. Patient evaluated. X-Rays reviewed.  2.  Since the lesion is symptomatic and it has recently developed over the last 3-4 months I do recommend surgical excision.  All possible complications and details of procedure were explained.  No guarantees were expressed or implied. 3.  Authorization for surgery was initiated today.  Surgery will consist of  excisional biopsy right soft tissue mass 4.  Postsurgical shoe dispensed today 5.  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 Felecia Shelling, DPM    [address] [place], [place] [address]                Office [phone]  Fax [phone]

## 2020-03-15 ENCOUNTER — Encounter: Payer: Self-pay | Admitting: *Deleted

## 2020-03-15 ENCOUNTER — Other Ambulatory Visit: Payer: Self-pay | Admitting: Podiatry

## 2020-03-15 DIAGNOSIS — M67471 Ganglion, right ankle and foot: Secondary | ICD-10-CM

## 2020-03-15 MED ORDER — IBUPROFEN 800 MG PO TABS: 800.0000 mg | ORAL_TABLET | Freq: Three times a day (TID) | ORAL | 1 refills | Status: DC

## 2020-03-15 MED ORDER — OXYCODONE-ACETAMINOPHEN 5-325 MG PO TABS: 1.0000 | ORAL_TABLET | ORAL | 0 refills | Status: DC | PRN

## 2020-03-15 NOTE — Progress Notes (Signed)
PRN postop 

## 2020-03-23 ENCOUNTER — Ambulatory Visit (INDEPENDENT_AMBULATORY_CARE_PROVIDER_SITE_OTHER): Payer: 59 | Admitting: Podiatry

## 2020-03-23 ENCOUNTER — Other Ambulatory Visit: Payer: Self-pay

## 2020-03-23 ENCOUNTER — Encounter: Payer: Self-pay | Admitting: Podiatry

## 2020-03-23 DIAGNOSIS — M67471 Ganglion, right ankle and foot: Secondary | ICD-10-CM

## 2020-03-23 DIAGNOSIS — Z9889 Other specified postprocedural states: Secondary | ICD-10-CM

## 2020-03-23 NOTE — Progress Notes (Signed)
   Subjective:  Patient presents today status post excision of soft tissue mass right foot. DOS: 03/15/2020.  Patient states that he is feeling well today.  The pain has subsided significantly over the last few days.  He has been weightbearing in the postsurgical shoe as directed.  No new complaints at this time  Past Medical History:  Diagnosis Date  . Amblyopia       Objective/Physical Exam Neurovascular status intact.  Skin incisions appear to be well coapted with sutures intact. No sign of infectious process noted. No dehiscence. No active bleeding noted. Moderate edema noted to the surgical extremity.  Assessment: 1. s/p excision of benign soft tissue mass right foot. DOS: 03/15/2020   Plan of Care:  1. Patient was evaluated.  2.  Dressings changed.  He may begin washing and showering and getting the foot wet. 3.  Recommend Ace wrap daily.  Ace wrap applied and dispensed 4.  Continue weightbearing in the postsurgical shoe or any shoe that does not irritate the surgical area 5.  Pathology pending.  6.  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 Felecia Shelling, DPM    [address]                                        [place], [place] [address]                Office [phone]  Fax [phone]

## 2020-03-30 ENCOUNTER — Encounter: Payer: Self-pay | Admitting: Podiatry

## 2020-03-30 ENCOUNTER — Other Ambulatory Visit: Payer: Self-pay

## 2020-03-30 ENCOUNTER — Ambulatory Visit (INDEPENDENT_AMBULATORY_CARE_PROVIDER_SITE_OTHER): Payer: 59 | Admitting: Podiatry

## 2020-03-30 DIAGNOSIS — M67471 Ganglion, right ankle and foot: Secondary | ICD-10-CM

## 2020-03-30 DIAGNOSIS — Z9889 Other specified postprocedural states: Secondary | ICD-10-CM

## 2020-03-30 DIAGNOSIS — M7989 Other specified soft tissue disorders: Secondary | ICD-10-CM

## 2020-03-30 NOTE — Progress Notes (Signed)
° °  Subjective:  Patient presents today status post excision of soft tissue mass right foot. DOS: 03/15/2020.  Patient states that he is getting to where the incision does not even bother him anymore.  No new complaints at this time  Past Medical History:  Diagnosis Date   Amblyopia       Objective/Physical Exam Neurovascular status intact.  Skin incisions appear to be well coapted with sutures intact. No sign of infectious process noted. No dehiscence. No active bleeding noted. Moderate edema noted to the surgical extremity.  Assessment: 1. s/p excision of benign soft tissue mass right foot. DOS: 03/15/2020   Plan of Care:  1. Patient was evaluated.  2.  Sutures removed today. 3.  Continue compression socks daily x3 weeks 4.  Patient may now resume full activity no restrictions 5.  Recommend good supportive sneakers 6.  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 Felecia Shelling, DPM    [address]                                        [place], [place] [address]                Office [phone]  Fax [phone]

## 2020-04-13 ENCOUNTER — Encounter: Payer: 59 | Admitting: Podiatry

## 2020-05-06 ENCOUNTER — Other Ambulatory Visit: Payer: Self-pay | Admitting: Podiatry

## 2020-05-07 NOTE — Telephone Encounter (Signed)
Please advise 

## 2020-06-14 ENCOUNTER — Telehealth: Payer: Self-pay | Admitting: Family Medicine

## 2020-06-14 ENCOUNTER — Other Ambulatory Visit: Payer: Self-pay | Admitting: Adult Health

## 2020-06-14 MED ORDER — PREDNISONE 10 MG (21) PO TBPK: ORAL_TABLET | ORAL | 0 refills | Status: DC

## 2020-06-14 MED ORDER — IPRATROPIUM-ALBUTEROL 0.5-2.5 (3) MG/3ML IN SOLN: 3.0000 mL | Freq: Four times a day (QID) | RESPIRATORY_TRACT | 0 refills | Status: DC | PRN

## 2020-06-14 NOTE — Telephone Encounter (Signed)
Copied from CRM [id number]. Topic: Quick Communication - Rx Refill/Question >> Jun 14, 2020 11:00 AM Jaquita Rector A wrote: Medication: Levalbuterol nebulizer solution 1.25 mg  0.63 mg  Has the patient contacted their pharmacy? Yes.   (Agent: If no, request that the patient contact the pharmacy for the refill.) (Agent: If yes, when and what did the pharmacy advise?)  Preferred Pharmacy (with phone number or street name): [id number] [place], [place] - [address]  Phone:  [phone] Fax:  [phone]     Agent: Please be advised that RX refills may take up to 3 business days. We ask that you follow-up with your pharmacy.

## 2020-06-14 NOTE — Telephone Encounter (Signed)
Spoke with patient, levaalbuterol not covered by insurance he has a nebulizer machine - duoneb was sent in use as directed.  [facility]in and given albuterol inhaler refill, he prefers nebulizer. He reports his symptoms are improving since he was seen.Denies any distress or worsening symptoms.  He is aware office is closed for holiday staring 06/15/2020.0 Red Flags discussed. The patient was given clear instructions to go to ER or return to medical center if any red flags develop, symptoms do not improve, worsen or new problems develop. They verbalized understanding.

## 2020-06-14 NOTE — Telephone Encounter (Signed)
Please review patient request below. Requested medication is not on patient's current med list.

## 2020-06-19 ENCOUNTER — Other Ambulatory Visit: Payer: Self-pay | Admitting: Adult Health

## 2020-06-19 ENCOUNTER — Other Ambulatory Visit: Payer: Self-pay

## 2020-06-19 ENCOUNTER — Ambulatory Visit (INDEPENDENT_AMBULATORY_CARE_PROVIDER_SITE_OTHER): Payer: 59 | Admitting: Family Medicine

## 2020-06-19 DIAGNOSIS — J1282 Pneumonia due to coronavirus disease 2019: Secondary | ICD-10-CM

## 2020-06-19 DIAGNOSIS — I10 Essential (primary) hypertension: Secondary | ICD-10-CM

## 2020-06-19 DIAGNOSIS — U071 COVID-19: Secondary | ICD-10-CM | POA: Diagnosis not present

## 2020-06-19 MED ORDER — AMLODIPINE BESYLATE 2.5 MG PO TABS: 2.5000 mg | ORAL_TABLET | Freq: Every day | ORAL | 3 refills | Status: DC

## 2020-06-19 MED ORDER — DOXYCYCLINE HYCLATE 100 MG PO TABS: 100.0000 mg | ORAL_TABLET | Freq: Two times a day (BID) | ORAL | 0 refills | Status: DC

## 2020-06-19 MED ORDER — ALBUTEROL SULFATE HFA 108 (90 BASE) MCG/ACT IN AERS: 2.0000 | INHALATION_SPRAY | Freq: Four times a day (QID) | RESPIRATORY_TRACT | 1 refills | Status: DC | PRN

## 2020-06-19 NOTE — Progress Notes (Signed)
MyChart Video Visit    Virtual Visit via Video Note   This visit type was conducted due to national recommendations for restrictions regarding the COVID-19 Pandemic (e.g. social distancing) in an effort to limit this patient's exposure and mitigate transmission in our community. This patient is at least at moderate risk for complications without adequate follow up. This format is felt to be most appropriate for this patient at this time. Physical exam was limited by quality of the video and audio technology used for the visit.   Patient location: home Provider location: [facility]  I discussed the limitations of evaluation and management by telemedicine and the availability of in person appointments. The patient expressed understanding and agreed to proceed.  Patient: Joshua Hendrix   DOB: Feb 25, 1991   30 y.o. Male  MRN: [id number] Visit Date: 06/19/2020  Today's healthcare provider: [name], MD   No chief complaint on file.  Subjective    Cough This is a new problem. Episode onset: 2 weeks ago.    Patient was seen at an urgent care on 06/12/2020 and tested positive for COVID-19 infection. He was given a prescription for Albuterol inhaler and Augmentin.  Improving, but still has a lot of chest congestion and bloody mucous. Still short of breath with minimal exertion, but     Medications: Outpatient Medications Prior to Visit  Medication Sig  . albuterol (VENTOLIN HFA) 108 (90 Base) MCG/ACT inhaler Inhale into the lungs.  Marland Kitchen amLODipine (NORVASC) 2.5 MG tablet TAKE 1 TABLET BY MOUTH EVERY DAY  . cetirizine (ZYRTEC) 10 MG chewable tablet Chew 10 mg by mouth daily.  Marland Kitchen ibuprofen (ADVIL) 800 MG tablet TAKE 1 TABLET BY MOUTH THREE TIMES A DAY  . ipratropium-albuterol (DUONEB) 0.5-2.5 (3) MG/3ML SOLN Take 3 mLs by nebulization every 6 (six) hours as needed.  Marland Kitchen lisinopril (ZESTRIL) 5 MG tablet TAKE 1 TABLET BY MOUTH EVERY DAY  . oxyCODONE-acetaminophen (PERCOCET) 5-325 MG tablet Take 1  tablet by mouth every 4 (four) hours as needed for severe pain.  . predniSONE (STERAPRED UNI-PAK 21 TAB) 10 MG (21) TBPK tablet PO: Take 6 tablets on day 1:Take 5 tablets day 2:Take 4 tablets day 3: Take 3 tablets day 4:Take 2 tablets day five: 5 Take 1 tablet day 6   No facility-administered medications prior to visit.    Review of Systems  Respiratory: Positive for cough.       Objective       Physical Exam   Awake, alert, oriented x 3. In no apparent distress   Assessment & Plan     1. Pneumonia due to COVID-19 virus Had been improving but is starting to get a little worse. He felt like the albuterol was the most helpful for his cough and breathing and will refill today along with antibiotic to cover for secondary infections.  - doxycycline (VIBRA-TABS) 100 MG tablet; Take 1 tablet (100 mg total) by mouth 2 (two) times daily.  Dispense: 20 tablet; Refill: 0 - albuterol (VENTOLIN HFA) 108 (90 Base) MCG/ACT inhaler; Inhale 2 puffs into the lungs every 6 (six) hours as needed for wheezing or shortness of breath.  Dispense: 18 g; Refill: 1  Get - DG Chest 2 View; Future prior to follow up next week.   Recommend he monitor home oxygen levels and go to ER if he feels significantly worse of if oxygen saturation stays consistently below 90 . 2. Hypertension, unspecified type refill- amLODipine (NORVASC) 2.5 MG tablet; Take 1 tablet (  2.5 mg total) by mouth daily.  Dispense: 90 tablet; Refill: 3    No follow-ups on file.     I discussed the assessment and treatment plan with the patient. The patient was provided an opportunity to ask questions and all were answered. The patient agreed with the plan and demonstrated an understanding of the instructions.   The patient was advised to call back or seek an in-person evaluation if the symptoms worsen or if the condition fails to improve as anticipated.  I provided 12 minutes of non-face-to-face time during this encounter.  The entirety  of the information documented in the History of Present Illness, Review of Systems and Physical Exam were personally obtained by me. Portions of this information were initially documented by the CMA and reviewed by me for thoroughness and accuracy.     [name], MD [facility] [phone] (phone) [phone] (fax)  [facility]

## 2020-06-26 ENCOUNTER — Ambulatory Visit
Admission: RE | Admit: 2020-06-26 | Discharge: 2020-06-26 | Disposition: A | Discharge status: Home / Self Care | Payer: 59 | Source: Ambulatory Visit | Attending: Family Medicine | Admitting: Family Medicine

## 2020-06-26 ENCOUNTER — Other Ambulatory Visit: Payer: Self-pay

## 2020-06-26 ENCOUNTER — Ambulatory Visit
Admission: RE | Admit: 2020-06-26 | Discharge: 2020-06-26 | Disposition: A | Discharge status: Home / Self Care | Payer: 59 | Attending: Family Medicine | Admitting: Family Medicine

## 2020-06-26 DIAGNOSIS — U071 COVID-19: Secondary | ICD-10-CM

## 2020-06-26 DIAGNOSIS — J1282 Pneumonia due to coronavirus disease 2019: Secondary | ICD-10-CM | POA: Insufficient documentation

## 2020-06-27 ENCOUNTER — Telehealth (INDEPENDENT_AMBULATORY_CARE_PROVIDER_SITE_OTHER): Payer: 59 | Admitting: Family Medicine

## 2020-06-27 ENCOUNTER — Telehealth: Payer: Self-pay | Admitting: Family Medicine

## 2020-06-27 ENCOUNTER — Encounter: Payer: Self-pay | Admitting: Family Medicine

## 2020-06-27 DIAGNOSIS — J1282 Pneumonia due to coronavirus disease 2019: Secondary | ICD-10-CM

## 2020-06-27 DIAGNOSIS — U071 COVID-19: Secondary | ICD-10-CM

## 2020-06-27 NOTE — Progress Notes (Signed)
Virtual telephone visit    Virtual Visit via Telephone Note   This visit type was conducted due to national recommendations for restrictions regarding the COVID-19 Pandemic (e.g. social distancing) in an effort to limit this patient's exposure and mitigate transmission in our community. Due to his co-morbid illnesses, this patient is at least at moderate risk for complications without adequate follow up. This format is felt to be most appropriate for this patient at this time. The patient did not have access to video technology or had technical difficulties with video requiring transitioning to audio format only (telephone). Physical exam was limited to content and character of the telephone converstion.    Patient location: home Provider location: bfp  I discussed the limitations of evaluation and management by telemedicine and the availability of in person appointments. The patient expressed understanding and agreed to proceed.   Visit Date: 06/27/2020  Today's healthcare provider: [name], MD   Chief Complaint  Patient presents with  . Covid Positive  . Pneumonia   Subjective    HPI  Follow up for Pneumonia due to COVID:  The patient was last seen for this on 06/19/2020 via telephone visit.  Changes made at last visit include refilling albuterol inhaler along with antibiotic to cover for secondary infections.  He reports good compliance with treatment. He feels that condition is Improved. He feels 80% better. He still has some shortness of breath, scratchy throat, throat congestion and hoarseness. Only need to use inhaler once daily He is not having side effects.   -----------------------------------------------------------------------------------------       Medications: Outpatient Medications Prior to Visit  Medication Sig  . albuterol (VENTOLIN HFA) 108 (90 Base) MCG/ACT inhaler Inhale 2 puffs into the lungs every 6 (six) hours as needed for wheezing or  shortness of breath.  Marland Kitchen amLODipine (NORVASC) 2.5 MG tablet Take 1 tablet (2.5 mg total) by mouth daily.  . cetirizine (ZYRTEC) 10 MG chewable tablet Chew 10 mg by mouth daily.  Marland Kitchen doxycycline (VIBRA-TABS) 100 MG tablet Take 1 tablet (100 mg total) by mouth 2 (two) times daily.  Marland Kitchen ibuprofen (ADVIL) 800 MG tablet TAKE 1 TABLET BY MOUTH THREE TIMES A DAY  . lisinopril (ZESTRIL) 5 MG tablet TAKE 1 TABLET BY MOUTH EVERY DAY  . [DISCONTINUED] oxyCODONE-acetaminophen (PERCOCET) 5-325 MG tablet Take 1 tablet by mouth every 4 (four) hours as needed for severe pain. (Patient not taking: Reported on 06/27/2020)  . [DISCONTINUED] predniSONE (STERAPRED UNI-PAK 21 TAB) 10 MG (21) TBPK tablet PO: Take 6 tablets on day 1:Take 5 tablets day 2:Take 4 tablets day 3: Take 3 tablets day 4:Take 2 tablets day five: 5 Take 1 tablet day 6 (Patient not taking: Reported on 06/27/2020)   No facility-administered medications prior to visit.    Review of Systems  Constitutional: Negative for appetite change, chills and fever.  HENT: Positive for congestion, sore throat and voice change.   Respiratory: Positive for cough, shortness of breath and wheezing. Negative for chest tightness.   Cardiovascular: Negative for chest pain and palpitations.  Gastrointestinal: Negative for abdominal pain, nausea and vomiting.      Objective    There were no vitals taken for this visit.   Awake, alert, oriented x 3. In no apparent distress   CXR stable, no acute changes  Assessment & Plan     1. Pneumonia due to COVID-19 virus Symptoms mostly resolved, chest xr stable. Only requiring inhaler once a day. Nearly finished with antibiotic. Is  to call if any symptoms return or worsen. He would like to have a refill of albuterol on hand in case of any flares in the future. Is changing to [facility] pharmacy in February so will send refill then.        I discussed the assessment and treatment plan with the patient. The patient was  provided an opportunity to ask questions and all were answered. The patient agreed with the plan and demonstrated an understanding of the instructions.   The patient was advised to call back or seek an in-person evaluation if the symptoms worsen or if the condition fails to improve as anticipated.  I provided 8 minutes of non-face-to-face time during this encounter.  The entirety of the information documented in the History of Present Illness, Review of Systems and Physical Exam were personally obtained by me. Portions of this information were initially documented by the CMA and reviewed by me for thoroughness and accuracy.     [name], MD [facility] [phone] (phone) [phone] (fax)  [facility]

## 2020-07-11 ENCOUNTER — Other Ambulatory Visit: Payer: Self-pay

## 2020-07-11 MED ORDER — IBUPROFEN 800 MG PO TABS: 800.0000 mg | ORAL_TABLET | Freq: Three times a day (TID) | ORAL | 1 refills | Status: DC

## 2020-07-17 ENCOUNTER — Other Ambulatory Visit: Payer: Self-pay | Admitting: Family Medicine

## 2020-07-17 DIAGNOSIS — U071 COVID-19: Secondary | ICD-10-CM

## 2020-07-17 DIAGNOSIS — J1282 Pneumonia due to coronavirus disease 2019: Secondary | ICD-10-CM

## 2020-07-17 MED ORDER — ALBUTEROL SULFATE HFA 108 (90 BASE) MCG/ACT IN AERS: 2.0000 | INHALATION_SPRAY | Freq: Four times a day (QID) | RESPIRATORY_TRACT | 1 refills | Status: DC | PRN

## 2020-08-26 ENCOUNTER — Other Ambulatory Visit: Payer: Self-pay | Admitting: Family Medicine

## 2020-08-26 DIAGNOSIS — I10 Essential (primary) hypertension: Secondary | ICD-10-CM

## 2020-08-26 NOTE — Telephone Encounter (Signed)
Requested Prescriptions  Pending Prescriptions Disp Refills  . lisinopril (ZESTRIL) 5 MG tablet [Pharmacy Med Name: LISINOPRIL 5 MG TABLET] 30 tablet 0    Sig: TAKE 1 TABLET BY MOUTH EVERY DAY     Cardiovascular:  ACE Inhibitors Failed - 08/26/2020 12:51 AM      Failed - Cr in normal range and within 180 days    Creatinine, Ser  Date Value Ref Range Status  06/20/2019 1.00 0.76 - 1.27 mg/dL Final         Failed - K in normal range and within 180 days    Potassium  Date Value Ref Range Status  06/20/2019 4.3 3.5 - 5.2 mmol/L Final         Failed - Last BP in normal range    BP Readings from Last 1 Encounters:  02/10/20 (!) 146/76         Passed - Patient is not pregnant      Passed - Valid encounter within last 6 months    Recent Outpatient Visits          2 months ago Pneumonia due to COVID-19 virus   [facility] Malva Limes, MD   2 months ago Pneumonia due to COVID-19 virus   [facility] Malva Limes, MD   6 months ago Infected sebaceous cyst of foot   [facility] Malva Limes, MD   1 year ago Hypertension, unspecified type   [facility] Malva Limes, MD   1 year ago Hypertension, unspecified type   [facility] Malva Limes, MD

## 2020-08-28 ENCOUNTER — Other Ambulatory Visit: Payer: Self-pay | Admitting: Family Medicine

## 2020-08-28 DIAGNOSIS — I10 Essential (primary) hypertension: Secondary | ICD-10-CM

## 2020-09-24 ENCOUNTER — Telehealth: Payer: Self-pay

## 2020-09-24 DIAGNOSIS — I10 Essential (primary) hypertension: Secondary | ICD-10-CM

## 2020-09-24 MED ORDER — LISINOPRIL 5 MG PO TABS: 5.0000 mg | ORAL_TABLET | Freq: Every day | ORAL | 1 refills | Status: DC

## 2020-09-24 MED ORDER — AMLODIPINE BESYLATE 2.5 MG PO TABS: 2.5000 mg | ORAL_TABLET | Freq: Every day | ORAL | 1 refills | Status: DC

## 2020-09-24 NOTE — Telephone Encounter (Signed)
Copied from CRM [id number]. Topic: General - Inquiry >> Sep 24, 2020 10:21 AM Daphine Deutscher D wrote: Reason for CRM: Pt called saying the office is sending his prescriptions to the wrong pharmacy.  He uses [facility].  Not [facility].

## 2020-09-24 NOTE — Telephone Encounter (Signed)
Prescriptions sent to [facility]. Patient notified.

## 2020-09-24 NOTE — Addendum Note (Signed)
Addended by: Benjiman Core on: 09/24/2020 12:17 PM   Modules accepted: Orders

## 2020-09-26 ENCOUNTER — Telehealth: Payer: Self-pay | Admitting: Family Medicine

## 2020-09-26 NOTE — Telephone Encounter (Signed)
Copied from CRM [id number]. Topic: Quick Communication - Rx Refill/Question >> Sep 26, 2020  9:09 AM Jaquita Rector A wrote: Medication: predniSONE (DELTASONE) 20 MG tablet Per patient has rashes all over from poison ivy again   Has the patient contacted their pharmacy? Yes.   (Agent: If no, request that the patient contact the pharmacy for the refill.) (Agent: If yes, when and what did the pharmacy advise?)  Preferred Pharmacy (with phone number or street name): [id number] [place] Rough, [place] - [address] AT [facility] [address]  Phone:  [phone] Fax:  [phone]     Agent: Please be advised that RX refills may take up to 3 business days. We ask that you follow-up with your pharmacy.

## 2020-09-27 NOTE — Telephone Encounter (Signed)
Appointment made

## 2020-09-28 ENCOUNTER — Telehealth (INDEPENDENT_AMBULATORY_CARE_PROVIDER_SITE_OTHER): Payer: BC Managed Care – PPO | Admitting: Family Medicine

## 2020-09-28 ENCOUNTER — Encounter: Payer: Self-pay | Admitting: Family Medicine

## 2020-09-28 DIAGNOSIS — L247 Irritant contact dermatitis due to plants, except food: Secondary | ICD-10-CM

## 2020-09-28 MED ORDER — PREDNISONE 20 MG PO TABS: ORAL_TABLET | ORAL | 0 refills | Status: AC

## 2020-09-28 NOTE — Progress Notes (Signed)
MyChart Video Visit    Virtual Visit via Video Note   This visit type was conducted due to national recommendations for restrictions regarding the COVID-19 Pandemic (e.g. social distancing) in an effort to limit this patient's exposure and mitigate transmission in our community. This patient is at least at moderate risk for complications without adequate follow up. This format is felt to be most appropriate for this patient at this time. Physical exam was limited by quality of the video and audio technology used for the visit.   Patient location: home Provider location: bfp  I discussed the limitations of evaluation and management by telemedicine and the availability of in person appointments. The patient expressed understanding and agreed to proceed.  Patient: Joshua Hendrix   DOB: [birth year]   30 y.o. Male  MRN: [id number] Visit Date: 09/28/2020  Today's healthcare provider: [name], MD   Chief Complaint  Patient presents with  . Rash   Subjective    Rash This is a new problem. Episode onset: 3-4 days ago  The problem has been gradually worsening since onset. The affected locations include the left arm, right arm, right axilla, left axilla, left upper leg and right upper leg. The rash is characterized by redness, itchiness and draining. He was exposed to plant contact. Pertinent negatives include no fever, shortness of breath or vomiting. Treatments tried: Benadryl. The treatment provided no relief.    He has been working around poison ivy and/or poison oak the last few weeks, usually uses Tecnu which had worked well, but broke out in typical rash on both arms and axillae the last four days.     Medications: Outpatient Medications Prior to Visit  Medication Sig  . albuterol (VENTOLIN HFA) 108 (90 Base) MCG/ACT inhaler Inhale 2 puffs into the lungs every 6 (six) hours as needed for wheezing or shortness of breath.  Marland Kitchen amLODipine (NORVASC) 2.5 MG tablet Take 1 tablet (2.5  mg total) by mouth daily.  . cetirizine (ZYRTEC) 10 MG chewable tablet Chew 10 mg by mouth daily.  Marland Kitchen doxycycline (VIBRA-TABS) 100 MG tablet Take 1 tablet (100 mg total) by mouth 2 (two) times daily.  Marland Kitchen ibuprofen (ADVIL) 800 MG tablet Take 1 tablet (800 mg total) by mouth 3 (three) times daily.  Marland Kitchen lisinopril (ZESTRIL) 5 MG tablet Take 1 tablet (5 mg total) by mouth daily.   No facility-administered medications prior to visit.    Review of Systems  Constitutional: Negative for appetite change, chills and fever.  Respiratory: Negative for chest tightness, shortness of breath and wheezing.   Cardiovascular: Negative for chest pain and palpitations.  Gastrointestinal: Negative for abdominal pain, nausea and vomiting.  Skin: Positive for rash.       Itchy skin      Objective    There were no vitals taken for this visit.   Physical Exam   Scattered and linear vesicular eruptions of forearms.   Assessment & Plan     1. Contact dermatitis and eczema due to plant Spread to axillae with minimal relief from topical Benadryl. Has done well with steroid tapers in the past.   - predniSONE (DELTASONE) 20 MG tablet; Take 3 tabs by mouth daily for 4 days, then 2 tabs daily for 4 days, then 1 tab daily for 4 days, then 1/2 tab daily for 2 days  Dispense: 25 tablet; Refill: 0        I discussed the assessment and treatment plan with the patient. The patient  was provided an opportunity to ask questions and all were answered. The patient agreed with the plan and demonstrated an understanding of the instructions.   The patient was advised to call back or seek an in-person evaluation if the symptoms worsen or if the condition fails to improve as anticipated.  I provided 9 minutes of non-face-to-face time during this encounter.  The entirety of the information documented in the History of Present Illness, Review of Systems and Physical Exam were personally obtained by me. Portions of this  information were initially documented by the CMA and reviewed by me for thoroughness and accuracy.     [name], MD [facility] [phone] (phone) [phone] (fax)  [facility]

## 2021-01-10 DIAGNOSIS — M25531 Pain in right wrist: Secondary | ICD-10-CM | POA: Diagnosis not present

## 2021-01-10 DIAGNOSIS — M25511 Pain in right shoulder: Secondary | ICD-10-CM | POA: Diagnosis not present

## 2021-03-30 ENCOUNTER — Other Ambulatory Visit: Payer: Self-pay | Admitting: Family Medicine

## 2021-03-30 DIAGNOSIS — I10 Essential (primary) hypertension: Secondary | ICD-10-CM

## 2021-03-30 NOTE — Telephone Encounter (Signed)
Requested medications are due for refill today yes  Requested medications are on the active medication list yes  Last refill 01/02/21  Last visit 06/19/20, last lab was 06/20/19  Future visit scheduled no  Notes to clinic .failed protocol of labs and visit within 6 months, no upcoming visit  Requested Prescriptions  Pending Prescriptions Disp Refills   lisinopril (ZESTRIL) 5 MG tablet [Pharmacy Med Name: LISINOPRIL 5MG  TABLETS] 90 tablet 1    Sig: TAKE 1 TABLET(5 MG) BY MOUTH DAILY     Cardiovascular:  ACE Inhibitors Failed - 03/30/2021  3:07 AM      Failed - Cr in normal range and within 180 days    Creatinine, Ser  Date Value Ref Range Status  06/20/2019 1.00 0.76 - 1.27 mg/dL Final          Failed - K in normal range and within 180 days    Potassium  Date Value Ref Range Status  06/20/2019 4.3 3.5 - 5.2 mmol/L Final          Failed - Last BP in normal range    BP Readings from Last 1 Encounters:  02/10/20 (!) 146/76          Failed - Valid encounter within last 6 months    Recent Outpatient Visits           6 months ago Contact dermatitis and eczema due to plant   [facility], MD   9 months ago Pneumonia due to COVID-19 virus   [facility], MD   9 months ago Pneumonia due to COVID-19 virus   [facility], Sherrie Mustache, MD   1 year ago Infected sebaceous cyst of foot   [facility], MD   1 year ago Hypertension, unspecified type   [facility], MD              Passed - Patient is not pregnant

## 2021-05-13 ENCOUNTER — Telehealth (INDEPENDENT_AMBULATORY_CARE_PROVIDER_SITE_OTHER): Payer: BC Managed Care – PPO | Admitting: Family Medicine

## 2021-05-13 ENCOUNTER — Telehealth: Payer: Self-pay | Admitting: Family Medicine

## 2021-05-13 ENCOUNTER — Encounter: Payer: Self-pay | Admitting: Family Medicine

## 2021-05-13 ENCOUNTER — Ambulatory Visit
Admission: RE | Admit: 2021-05-13 | Discharge: 2021-05-13 | Disposition: A | Discharge status: Home / Self Care | Payer: BC Managed Care – PPO | Source: Ambulatory Visit | Attending: Family Medicine | Admitting: Family Medicine

## 2021-05-13 ENCOUNTER — Ambulatory Visit
Admission: RE | Admit: 2021-05-13 | Discharge: 2021-05-13 | Disposition: A | Discharge status: Home / Self Care | Payer: BC Managed Care – PPO | Attending: Family Medicine | Admitting: Family Medicine

## 2021-05-13 DIAGNOSIS — R509 Fever, unspecified: Secondary | ICD-10-CM | POA: Diagnosis not present

## 2021-05-13 DIAGNOSIS — R051 Acute cough: Secondary | ICD-10-CM

## 2021-05-13 DIAGNOSIS — R059 Cough, unspecified: Secondary | ICD-10-CM | POA: Diagnosis not present

## 2021-05-13 DIAGNOSIS — B349 Viral infection, unspecified: Secondary | ICD-10-CM

## 2021-05-13 MED ORDER — DOXYCYCLINE HYCLATE 100 MG PO TABS: 100.0000 mg | ORAL_TABLET | Freq: Two times a day (BID) | ORAL | 0 refills | Status: AC

## 2021-05-13 MED ORDER — PREDNISONE 10 MG (21) PO TBPK: ORAL_TABLET | ORAL | 0 refills | Status: DC

## 2021-05-13 NOTE — Progress Notes (Signed)
MyChart Video Visit    Virtual Visit via Video Note   This visit type was conducted due to national recommendations for restrictions regarding the COVID-19 Pandemic (e.g. social distancing) in an effort to limit this patient's exposure and mitigate transmission in our community. This patient is at least at moderate risk for complications without adequate follow up. This format is felt to be most appropriate for this patient at this time. Physical exam was limited by quality of the video and audio technology used for the visit.   Patient location: home Provider location: bfp  I discussed the limitations of evaluation and management by telemedicine and the availability of in person appointments. The patient expressed understanding and agreed to proceed.  Patient: Joshua Hendrix   DOB: 1990-09-14   30 y.o. Male  MRN: [id number] Visit Date: 05/13/2021  Today's healthcare provider: [name], MD   No chief complaint on file.  Subjective    This is a new problem. The current episode started in the past 5 days. The problem has been gradually worsening. Associated symptoms include congestion, coughing, diarrhea, headaches, a plugged ear sensation, rhinorrhea, sinus pain, a sore throat and wheezing. Pertinent negatives include no abdominal pain, chest pain, dysuria, ear pain or nausea.   Was much worse this morning when he woke up with coughing fit productive clear mucous. Had negative Covid test 3 days ago   Medications: Outpatient Medications Prior to Visit  Medication Sig   albuterol (VENTOLIN HFA) 108 (90 Base) MCG/ACT inhaler Inhale 2 puffs into the lungs every 6 (six) hours as needed for wheezing or shortness of breath.   amLODipine (NORVASC) 2.5 MG tablet Take 1 tablet (2.5 mg total) by mouth daily.   cetirizine (ZYRTEC) 10 MG chewable tablet Chew 10 mg by mouth daily.   doxycycline (VIBRA-TABS) 100 MG tablet Take 1 tablet (100 mg total) by mouth 2 (two) times daily.   ibuprofen  (ADVIL) 800 MG tablet Take 1 tablet (800 mg total) by mouth 3 (three) times daily.   lisinopril (ZESTRIL) 5 MG tablet TAKE 1 TABLET(5 MG) BY MOUTH DAILY   No facility-administered medications prior to visit.    Review of Systems  HENT:  Positive for congestion, postnasal drip, rhinorrhea, sinus pressure, sinus pain and sore throat. Negative for ear discharge and ear pain.   Respiratory:  Positive for cough, shortness of breath and wheezing. Negative for apnea, choking, chest tightness and stridor.   Cardiovascular:  Negative for chest pain.  Gastrointestinal:  Positive for diarrhea. Negative for abdominal pain and nausea.  Genitourinary:  Negative for dysuria.  Neurological:  Positive for headaches. Negative for dizziness and light-headedness.     Objective    There were no vitals taken for this visit.   Physical Exam   Awake, alert, oriented x 3. In no apparent distress   Assessment & Plan     1. Acute viral syndrome Suspect influenza. He is 5 days into symptoms out of window for Tamifly.   2. Fever, unspecified fever cause  - DG Chest 2 View; Future No sign of pneumonia on chest XR  3. Acute cough Taken turn for worse today concerning for transition to bacterial RI  He already has albuterol inhaler at home and encouraged to use on a regular schedule.   - doxycycline (VIBRA-TABS) 100 MG tablet; Take 1 tablet (100 mg total) by mouth 2 (two) times daily for 7 days.  Dispense: 14 tablet; Refill: 0 - predniSONE (STERAPRED UNI-PAK 21 TAB)  10 MG (21) TBPK tablet; PO: Take 6 tablets on day 1:Take 5 tablets day 2:Take 4 tablets day 3: Take 3 tablets day 4:Take 2 tablets day five: 5 Take 1 tablet day 6  Dispense: 21 tablet; Refill: 0       I discussed the assessment and treatment plan with the patient. The patient was provided an opportunity to ask questions and all were answered. The patient agreed with the plan and demonstrated an understanding of the instructions.   The  patient was advised to call back or seek an in-person evaluation if the symptoms worsen or if the condition fails to improve as anticipated.  I provided 12 minutes of non-face-to-face time during this encounter.  The entirety of the information documented in the History of Present Illness, Review of Systems and Physical Exam were personally obtained by me. Portions of this information were initially documented by the CMA and reviewed by me for thoroughness and accuracy.    [name], MD [facility] [phone] (phone) [phone] (fax)  [facility]

## 2021-05-13 NOTE — Telephone Encounter (Signed)
This patient has a virtual appointment scheduled today at 4pm. He needs to have a chest XR for cough and chest congestion. Can you see if he can to [facility] this morning so that I can look at the XR before his virtual visit this afternoon? thanks

## 2021-05-13 NOTE — Telephone Encounter (Signed)
Ciara from [facility] states patient needs order for chest Xray,before his appt. He has an apt tomorrow.

## 2021-05-13 NOTE — Telephone Encounter (Signed)
Pt advised.  He states he is going to try and go around 12-1 today.   Thanks,   -Vernona Rieger

## 2021-05-15 ENCOUNTER — Telehealth: Payer: BC Managed Care – PPO | Admitting: Family Medicine

## 2021-06-10 ENCOUNTER — Other Ambulatory Visit: Payer: Self-pay | Admitting: Family Medicine

## 2021-06-10 DIAGNOSIS — I10 Essential (primary) hypertension: Secondary | ICD-10-CM

## 2021-07-10 ENCOUNTER — Other Ambulatory Visit: Payer: Self-pay | Admitting: Family Medicine

## 2021-07-10 DIAGNOSIS — I10 Essential (primary) hypertension: Secondary | ICD-10-CM

## 2021-08-11 ENCOUNTER — Ambulatory Visit
Admission: EM | Admit: 2021-08-11 | Discharge: 2021-08-11 | Disposition: A | Discharge status: Home / Self Care | Payer: BC Managed Care – PPO | Attending: Family Medicine | Admitting: Family Medicine

## 2021-08-11 ENCOUNTER — Encounter: Payer: Self-pay | Admitting: Emergency Medicine

## 2021-08-11 DIAGNOSIS — S51852A Open bite of left forearm, initial encounter: Secondary | ICD-10-CM | POA: Diagnosis not present

## 2021-08-11 DIAGNOSIS — L089 Local infection of the skin and subcutaneous tissue, unspecified: Secondary | ICD-10-CM | POA: Diagnosis not present

## 2021-08-11 DIAGNOSIS — L03114 Cellulitis of left upper limb: Secondary | ICD-10-CM

## 2021-08-11 DIAGNOSIS — W5501XA Bitten by cat, initial encounter: Secondary | ICD-10-CM | POA: Diagnosis not present

## 2021-08-11 MED ORDER — AMOXICILLIN-POT CLAVULANATE 875-125 MG PO TABS: 1.0000 | ORAL_TABLET | Freq: Two times a day (BID) | ORAL | 0 refills | Status: DC

## 2021-08-11 MED ORDER — CEFTRIAXONE SODIUM 500 MG IJ SOLR
500.0000 mg | Freq: Once | INTRAMUSCULAR | Status: AC
  Administered 2021-08-11: 500 mg via INTRAMUSCULAR

## 2021-08-11 MED ORDER — MUPIROCIN 2 % EX OINT: 1.0000 "application " | TOPICAL_OINTMENT | Freq: Three times a day (TID) | CUTANEOUS | 0 refills | Status: DC

## 2021-08-11 NOTE — Discharge Instructions (Addendum)
Continue to monitor site for progressing infection.  If any additional swelling or redness persist despite treatment return immediately for evaluation.  If develop any fever, nausea or vomiting, or develop any severe purulent drainage from the site go immediately to the Emergency Department.

## 2021-08-11 NOTE — ED Triage Notes (Signed)
Pt here with cat bite to left forearm and erythema has started to spread since then. Cat is their own and is up-to-date on all vaccinations.

## 2021-08-11 NOTE — ED Provider Notes (Signed)
Joshua Hendrix    CSN: [id number] Arrival date & time: 08/11/21  1348      History   Chief Complaint Chief Complaint  Patient presents with   Animal Bite    HPI Joshua Hendrix is a 31 y.o. male.   HPI Patient presents today for evaluation of a cat bite puncture wound sustained to the left lower forearm.  Cat bite occurred 3 days ago.  Patient is concerned as he has had increased induration and redness expanding away from the site of the initial injury.  He is negative for fever, nausea, or vomiting. He is uncertain of when he received his last tetanus vaccine.  The cat is up-to-date on all of his vaccines as it is his personal cat.  Immunization History  Administered Date(s) Administered   DTaP 10/19/1991, 12/22/1991, 02/28/1992, 03/15/1993, 09/26/1996   HPV 9-valent 03/27/2015, 05/04/2015, 12/06/2015   Hepatitis A 07/07/2006, 02/28/2009   Hepatitis B 07/18/2009, 08/22/2009, 11/20/2009   HiB (PRP-OMP) 10/19/1991, 12/22/1991, 02/28/1992, 03/15/1993   IPV 10/19/1991, 12/22/1991, 03/15/1993, 09/26/1996   Influenza,inj,Quad PF,6+ Mos 03/16/2018, 06/20/2019   MMR 11/23/1992, 09/26/1996   Meningococcal Conjugate 02/28/2009   PFIZER Comirnaty(Gray Top)Covid-19 Tri-Sucrose Vaccine 10/28/2020   Td 11/26/2016   Tdap 07/07/2006   Varicella 02/26/1994, 07/07/2006    Past Medical History:  Diagnosis Date   Amblyopia     Patient Active Problem List   Diagnosis Date Noted   Obesity 03/16/2018   Amblyopia 01/02/2015   Low back pain 01/02/2015    Past Surgical History:  Procedure Laterality Date   CYST REMOVAL NECK  12/11/2009   TONSILLECTOMY  1997       Home Medications    Prior to Admission medications   Medication Sig Start Date End Date Taking? Authorizing Provider  albuterol (VENTOLIN HFA) 108 (90 Base) MCG/ACT inhaler Inhale 2 puffs into the lungs every 6 (six) hours as needed for wheezing or shortness of breath. 07/17/20 07/17/21  Birdie Sons, MD   amLODipine (NORVASC) 2.5 MG tablet Take 1 tablet (2.5 mg total) by mouth daily. 09/24/20   Birdie Sons, MD  cetirizine (ZYRTEC) 10 MG chewable tablet Chew 10 mg by mouth daily.    [provider]  ibuprofen (ADVIL) 800 MG tablet Take 1 tablet (800 mg total) by mouth 3 (three) times daily. 07/11/20   Edrick Kins, DPM  lisinopril (ZESTRIL) 5 MG tablet TAKE 1 TABLET(5 MG) BY MOUTH DAILY 07/10/21   Birdie Sons, MD  predniSONE (STERAPRED UNI-PAK 21 TAB) 10 MG (21) TBPK tablet PO: Take 6 tablets on day 1:Take 5 tablets day 2:Take 4 tablets day 3: Take 3 tablets day 4:Take 2 tablets day five: 5 Take 1 tablet day 6 05/13/21   Birdie Sons, MD    Family History Family History  Problem Relation Age of Onset   Heart Problems Mother        Heart valve problem   Hypertension Father    Hyperlipidemia Father    Heart attack Maternal Grandfather        3-4 heart attacks occured in his 82's   Stroke Maternal Grandfather    Cancer Neg Hx    Diabetes Neg Hx    Prostate cancer Neg Hx    Kidney cancer Neg Hx    Bladder Cancer Neg Hx     Social History Social History   Tobacco Use   Smoking status: Former   Smokeless tobacco: Never   Tobacco comments:  social smoker only  Substance Use Topics   Alcohol use: No    Alcohol/week: 0.0 standard drinks   Drug use: No     Allergies   Patient has no known allergies.   Review of Systems Review of Systems Pertinent negatives listed in HPI  Physical Exam Triage Vital Signs ED Triage Vitals  Enc Vitals Group     BP 08/11/21 1441 (!) 165/91     Pulse Rate 08/11/21 1441 88     Resp 08/11/21 1441 18     Temp 08/11/21 1441 98.4 F (36.9 C)     Temp src --      SpO2 08/11/21 1441 98 %     Weight --      Height --      Head Circumference --      Peak Flow --      Pain Score 08/11/21 1446 6     Pain Loc --      Pain Edu? --      Excl. in [place]? --    No data found.  Updated Vital Signs BP (!) 165/91    Pulse 88     Temp 98.4 F (36.9 C)    Resp 18    SpO2 98%   Visual Acuity Right Eye Distance:   Left Eye Distance:   Bilateral Distance:    Right Eye Near:   Left Eye Near:    Bilateral Near:     Physical Exam Constitutional:      Appearance: Normal appearance. He is obese.  HENT:     Head: Normocephalic and atraumatic.  Eyes:     Extraocular Movements: Extraocular movements intact.     Pupils: Pupils are equal, round, and reactive to light.  Cardiovascular:     Rate and Rhythm: Normal rate and regular rhythm.  Pulmonary:     Effort: Pulmonary effort is normal.  Musculoskeletal:       Arms:  Skin:    Capillary Refill: Capillary refill takes less than 2 seconds.  Neurological:     General: No focal deficit present.     Mental Status: He is alert and oriented to person, place, and time.  Psychiatric:        Attention and Perception: Attention normal.        Mood and Affect: Mood normal.   UC Treatments / Results  Labs (all labs ordered are listed, but only abnormal results are displayed) Labs Reviewed - No data to display  EKG   Radiology No results found.  Procedures Procedures (including critical care time)  Medications Ordered in UC Medications - No data to display  Initial Impression / Assessment and Plan / UC Course  I have reviewed the triage vital signs and the nursing notes.  Pertinent labs & imaging results that were available during my care of the patient were reviewed by me and considered in my medical decision making (see chart for details).    Infected cat bite, forearm, left  Cellulitis of left upper extremity  Rocephin 500 IM given here in clinic Doxycycline x 10 days and Bacitracin TID x 5-7 days. Strict ER precautions if infection worsen or doesn't appear to be improve after a few doses of medication.  Final Clinical Impressions(s) / UC Diagnoses   Final diagnoses:  Infected cat bite of forearm, left, initial encounter  Cellulitis of left upper  extremity     Discharge Instructions      Continue to monitor site for progressing infection.  If any additional swelling or redness persist despite treatment return immediately for evaluation.  If develop any fever, nausea or vomiting, or develop any severe purulent drainage from the site go immediately to the Emergency Department.    ED Prescriptions     Medication Sig Dispense Auth. Provider   amoxicillin-clavulanate (AUGMENTIN) 875-125 MG tablet Take 1 tablet by mouth 2 (two) times daily. 20 tablet Scot Jun, FNP   mupirocin ointment (BACTROBAN) 2 % Apply 1 application topically 3 (three) times daily. 60 g Scot Jun, FNP      PDMP not reviewed this encounter.   Scot Jun, [place] 08/14/21 [phone]

## 2021-09-10 ENCOUNTER — Other Ambulatory Visit: Payer: Self-pay

## 2021-09-10 ENCOUNTER — Ambulatory Visit
Admission: EM | Admit: 2021-09-10 | Discharge: 2021-09-10 | Disposition: A | Discharge status: Home / Self Care | Payer: BC Managed Care – PPO | Attending: Emergency Medicine | Admitting: Emergency Medicine

## 2021-09-10 ENCOUNTER — Encounter: Payer: Self-pay | Admitting: Emergency Medicine

## 2021-09-10 DIAGNOSIS — J069 Acute upper respiratory infection, unspecified: Secondary | ICD-10-CM

## 2021-09-10 DIAGNOSIS — J4521 Mild intermittent asthma with (acute) exacerbation: Secondary | ICD-10-CM

## 2021-09-10 DIAGNOSIS — J0100 Acute maxillary sinusitis, unspecified: Secondary | ICD-10-CM

## 2021-09-10 HISTORY — DX: Essential (primary) hypertension: I10

## 2021-09-10 MED ORDER — AEROCHAMBER PLUS MISC: 2 refills | Status: AC

## 2021-09-10 MED ORDER — ALBUTEROL SULFATE HFA 108 (90 BASE) MCG/ACT IN AERS: 1.0000 | INHALATION_SPRAY | RESPIRATORY_TRACT | 0 refills | Status: DC | PRN

## 2021-09-10 MED ORDER — DOXYCYCLINE HYCLATE 100 MG PO CAPS: 100.0000 mg | ORAL_CAPSULE | Freq: Two times a day (BID) | ORAL | 0 refills | Status: AC

## 2021-09-10 MED ORDER — PREDNISONE 50 MG PO TABS: 50.0000 mg | ORAL_TABLET | Freq: Every day | ORAL | 0 refills | Status: DC

## 2021-09-10 MED ORDER — PROMETHAZINE-DM 6.25-15 MG/5ML PO SYRP: 5.0000 mL | ORAL_SOLUTION | Freq: Four times a day (QID) | ORAL | 0 refills | Status: DC | PRN

## 2021-09-10 MED ORDER — FLUTICASONE PROPIONATE 50 MCG/ACT NA SUSP: 2.0000 | Freq: Every day | NASAL | 0 refills | Status: AC

## 2021-09-10 NOTE — Discharge Instructions (Addendum)
2 puffs from your albuterol inhaler every 4 hours for 2 days, then every 6 hours for 2 days, and then as needed.  You may back off on the albuterol if you start to improve sooner.  Finish prednisone and antibiotics, even if you feel better.  Flonase, saline nasal irrigation with a [facility] Med rinse and distilled water for the nasal congestion, sinus pain and pressure.  Promethazine DM for the cough ?

## 2021-09-10 NOTE — ED Provider Notes (Signed)
HPI ? ?SUBJECTIVE: ? ?Joshua Hendrix is a 31 y.o. male who presents with 5 days of chest congestion, cough productive of colored phlegm, shortness of breath, dyspnea on exertion, clear rhinorrhea, maxillary sinus pain and pressure, postnasal drip, wheezing, chest soreness secondary to the cough.  No facial swelling, upper dental pain, loss of sense of smell or taste, fevers, nausea, vomiting, diarrhea, abdominal pain.  He is unable to sleep at night secondary to the cough.  No known COVID or flu exposure.  He got 2 doses of the COVID-vaccine and this years flu vaccine.  No allergy symptoms.  He was treated with Augmentin 1.5 to 2 months ago for a cat bite.  No antipyretic in the past 6 hours.  He has tried Mucinex, TheraFlu with improvement, symptoms are worse with lying flat on his back and being outside with the pollen.  He states this feels similar to previous episodes of flu/COVID.  He has had COVID twice, last bout was 8 to 9 months ago, allergies, pneumonia, asthma.  He has not needed his rescue inhaler over the past 5 days. ? ? ? ?Past Medical History:  ?Diagnosis Date  ? Amblyopia   ? Hypertension   ? ? ?Past Surgical History:  ?Procedure Laterality Date  ? CYST REMOVAL NECK  12/11/2009  ? TONSILLECTOMY  1997  ? ? ?Family History  ?Problem Relation Age of Onset  ? Heart Problems Mother   ?     Heart valve problem  ? Hypertension Father   ? Hyperlipidemia Father   ? Heart attack Maternal Grandfather   ?     3-4 heart attacks occured in his 40's  ? Stroke Maternal Grandfather   ? Cancer Neg Hx   ? Diabetes Neg Hx   ? Prostate cancer Neg Hx   ? Kidney cancer Neg Hx   ? Bladder Cancer Neg Hx   ? ? ?Social History  ? ?Tobacco Use  ? Smoking status: Former  ? Smokeless tobacco: Never  ? Tobacco comments:  ?  social smoker only  ?Vaping Use  ? Vaping Use: Never used  ?Substance Use Topics  ? Alcohol use: No  ?  Alcohol/week: 0.0 standard drinks  ? Drug use: No  ? ? ?No current facility-administered medications for  this encounter. ? ?Current Outpatient Medications:  ?  albuterol (VENTOLIN HFA) 108 (90 Base) MCG/ACT inhaler, Inhale 2 puffs into the lungs every 6 (six) hours as needed for wheezing or shortness of breath., Disp: 18 g, Rfl: 1 ?  albuterol (VENTOLIN HFA) 108 (90 Base) MCG/ACT inhaler, Inhale 1-2 puffs into the lungs every 4 (four) hours as needed for wheezing or shortness of breath., Disp: 1 each, Rfl: 0 ?  amLODipine (NORVASC) 2.5 MG tablet, Take 1 tablet (2.5 mg total) by mouth daily., Disp: 90 tablet, Rfl: 1 ?  cetirizine (ZYRTEC) 10 MG chewable tablet, Chew 10 mg by mouth daily., Disp: , Rfl:  ?  doxycycline (VIBRAMYCIN) 100 MG capsule, Take 1 capsule (100 mg total) by mouth 2 (two) times daily for 10 days., Disp: 20 capsule, Rfl: 0 ?  fluticasone (FLONASE) 50 MCG/ACT nasal spray, Place 2 sprays into both nostrils daily., Disp: 16 g, Rfl: 0 ?  lisinopril (ZESTRIL) 5 MG tablet, TAKE 1 TABLET(5 MG) BY MOUTH DAILY, Disp: 30 tablet, Rfl: 0 ?  predniSONE (DELTASONE) 50 MG tablet, Take 1 tablet (50 mg total) by mouth daily with breakfast., Disp: 5 tablet, Rfl: 0 ?  promethazine-dextromethorphan (PROMETHAZINE-DM) 6.25-15 MG/5ML syrup,  Take 5 mLs by mouth 4 (four) times daily as needed for cough., Disp: 118 mL, Rfl: 0 ?  Spacer/Aero-Holding Chambers (AEROCHAMBER PLUS) inhaler, Use with inhaler, Disp: 1 each, Rfl: 2 ?  mupirocin ointment (BACTROBAN) 2 %, Apply 1 application topically 3 (three) times daily., Disp: 60 g, Rfl: 0 ? ?No Known Allergies ? ? ?ROS ? ?As noted in HPI.  ? ?Physical Exam ? ?BP (!) 155/74 (BP Location: Left Arm)   Pulse 72   Temp 98.8 ?F (37.1 ?C) (Oral)   Resp 18   SpO2 98%  ? ?Constitutional: Well developed, well nourished, no acute distress ?Eyes:  EOMI, conjunctiva normal bilaterally ?HENT: Normocephalic, atraumatic,mucus membranes moist.  Erythematous, swollen turbinates.  Clear nasal congestion.  Positive mild maxillary sinus tenderness.  No frontal sinus tenderness.  No obvious postnasal  drip ?Respiratory: Normal inspiratory effort lungs clear bilaterally, good air movement.  No anterior, lateral chest wall tenderness ?Cardiovascular: Normal rate, regular rhythm, no murmurs, rubs, gallops ?GI: nondistended ?skin: No rash, skin intact ?Musculoskeletal: no deformities ?Neurologic: Alert & oriented x 3, no focal neuro deficits ?Psychiatric: Speech and behavior appropriate ? ? ?ED Course ? ? ?Medications - No data to display ? ?No orders of the defined types were placed in this encounter. ? ? ?No results found for this or any previous visit (from the past 24 hour(s)). ?No results found. ? ?ED Clinical Impression ? ?1. Acute upper respiratory infection   ?2. Acute non-recurrent maxillary sinusitis   ?3. Intermittent asthma with acute exacerbation, unspecified asthma severity   ?  ? ?ED Assessment/Plan ? ?Concern for URI with resulting sinusitis/asthma exacerbation.  Doubt pneumonia at this point in time with clear lungs, afebrile, normal vitals.  Will send home with regularly scheduled albuterol with a spacer, prednisone 50 mg for 5 days, doxycycline for sinusitis, Flonase, saline nasal irrigation, Promethazine DM.  Follow-up with PCP if not feeling better after finishing the prednisone and doxycycline.  Did not do COVID or flu testing as he is out of the treatment window for flu, and by the time his COVID test will come back he will be out of the treatment window for that as well.  He states this does not feel like COVID. ? ?Discussed MDM, treatment plan, and plan for follow-up with patient.  patient agrees with plan.  ? ?Meds ordered this encounter  ?Medications  ? doxycycline (VIBRAMYCIN) 100 MG capsule  ?  Sig: Take 1 capsule (100 mg total) by mouth 2 (two) times daily for 10 days.  ?  Dispense:  20 capsule  ?  Refill:  0  ? albuterol (VENTOLIN HFA) 108 (90 Base) MCG/ACT inhaler  ?  Sig: Inhale 1-2 puffs into the lungs every 4 (four) hours as needed for wheezing or shortness of breath.  ?  Dispense:   1 each  ?  Refill:  0  ? fluticasone (FLONASE) 50 MCG/ACT nasal spray  ?  Sig: Place 2 sprays into both nostrils daily.  ?  Dispense:  16 g  ?  Refill:  0  ? Spacer/Aero-Holding Chambers (AEROCHAMBER PLUS) inhaler  ?  Sig: Use with inhaler  ?  Dispense:  1 each  ?  Refill:  2  ?  Please educate patient on use  ? promethazine-dextromethorphan (PROMETHAZINE-DM) 6.25-15 MG/5ML syrup  ?  Sig: Take 5 mLs by mouth 4 (four) times daily as needed for cough.  ?  Dispense:  118 mL  ?  Refill:  0  ? predniSONE (DELTASONE)  50 MG tablet  ?  Sig: Take 1 tablet (50 mg total) by mouth daily with breakfast.  ?  Dispense:  5 tablet  ?  Refill:  0  ? ? ? ? ?*This clinic note was created using [employer]. Therefore, there may be occasional mistakes despite careful proofreading. ? ?? ? ?  ?Melynda Ripple, MD ?09/11/21 0825 ? ?

## 2021-09-10 NOTE — ED Triage Notes (Signed)
Pt presents with cough, chest congestion, runny nose, watery eyes and SOB x 5 days  ?

## 2021-10-15 ENCOUNTER — Telehealth: Payer: Self-pay | Admitting: Family Medicine

## 2021-10-15 DIAGNOSIS — I10 Essential (primary) hypertension: Secondary | ICD-10-CM

## 2021-10-15 NOTE — Telephone Encounter (Signed)
Patient called in to inquire of Dr Sherrie Mustache about his Rx for lisinopril (ZESTRIL) 5 MG tablet  and amLODipine (NORVASC) 2.5 MG tablet  why he can not get both at the same time for 90 days. Per patient he have been on these medication for quite some time and would like to get both medications filled every 90 days so he does not have to make so many trips to the pharmacy every month. Please advise and call patient at  Ph# [phone] ?

## 2021-10-15 NOTE — Telephone Encounter (Signed)
Called patient and advised that he received a courtesy refill because we needed to follow-up on his Hypertension every 6 months. Patient asked why was he never told that he needed to be seen for this and also why are we putting messages on the script to the pharmacy. That is our office responsibility to call the patient if we need to see them in order to give the medication. He also said that during [place] we didn't want to see him in the office and we did video visit and now feels like we do want him to come in just to be able to prescribe his medicine. He also asked when was his last CPE (03/16/2018) last office visit for chronic issues was 06/20/2019 the rest is acute visits. ?Patient scheduled for Monday wanted something at 4pm gave him the 4:20 pm for follow-up only. ?

## 2021-10-16 ENCOUNTER — Telehealth: Payer: Self-pay | Admitting: Family Medicine

## 2021-10-16 NOTE — Telephone Encounter (Signed)
Pt specifically requests for his 90 day refills of Amlodipine and Lisinopril to be syncopated. He wants to receive his supplies at the same time for both he says.  ?

## 2021-10-17 ENCOUNTER — Telehealth: Payer: Self-pay

## 2021-10-17 NOTE — Telephone Encounter (Signed)
Copied from CRM [id number]. Topic: Complaint - Care ?>> Oct 16, 2021  5:02 PM Randol Kern wrote: ?Date of Incident: 10/16/2021 ?Details of complaint: Pt is upset that he received an automated message stating that his appt could possibly be a virtual visit. He is upset about this because he is demanding to be seen in office.  ?How would the patient like to see it resolved? Wants to speak to management  ? ? ?Route to Research officer, political party. ?

## 2021-10-17 NOTE — Telephone Encounter (Signed)
Pt has an appt next Monday if you would like me to tell him we can address then.   ?

## 2021-10-18 NOTE — Telephone Encounter (Signed)
I called and informed patient that our office manager is out of the office until next week. Patient verbalized understanding. He states " I really don't need a call from management, I just want this issue brought to their concern". Patient still plans to come in the office on Monday for appointment with Dr. Caryn Section at 4:20pm.  ?

## 2021-10-21 ENCOUNTER — Encounter: Payer: Self-pay | Admitting: Family Medicine

## 2021-10-21 ENCOUNTER — Ambulatory Visit: Payer: BC Managed Care – PPO | Admitting: Family Medicine

## 2021-10-21 VITALS — BP 138/74 | HR 74 | Temp 98.1°F | Resp 16 | Ht >= 80 in | Wt 362.0 lb

## 2021-10-21 DIAGNOSIS — I10 Essential (primary) hypertension: Secondary | ICD-10-CM | POA: Diagnosis not present

## 2021-10-21 MED ORDER — LISINOPRIL 5 MG PO TABS: 5.0000 mg | ORAL_TABLET | Freq: Every day | ORAL | 4 refills | Status: DC

## 2021-10-21 MED ORDER — AMLODIPINE BESYLATE 2.5 MG PO TABS: 2.5000 mg | ORAL_TABLET | Freq: Every day | ORAL | 4 refills | Status: DC

## 2021-10-21 NOTE — Progress Notes (Signed)
I,[name],acting as a scribe for [name], MD.,have documented all relevant documentation on the behalf of [name], MD,as directed by  [name], MD while in the presence of [name], MD.    Established patient visit   Patient: Joshua Hendrix   DOB: 08/17/1990   31 y.o. Male  MRN: [id number] Visit Date: 10/21/2021  Today's healthcare provider: [name], MD   Chief Complaint  Patient presents with   Hypertension   Subjective    HPI  Hypertension, follow-up  BP Readings from Last 3 Encounters:  10/21/21 138/74  09/10/21 (!) 155/74  08/11/21 (!) 165/91   Wt Readings from Last 3 Encounters:  10/21/21 (!) 362 lb (164.2 kg)  02/10/20 (!) 370 lb (167.8 kg)  06/20/19 (!) 369 lb (167.4 kg)     He was last seen for hypertension on 06/19/2020.    Management since that visit includes continuing same medication.  He reports good compliance with treatment. He is not having side effects.  He is following a Regular diet. He is exercising. He does not smoke.  Use of agents associated with hypertension: none.   Outside blood pressures are 130's systolic. Patient unsure of diastolic readings Symptoms: No chest pain No chest pressure  No palpitations No syncope  No dyspnea No orthopnea  No paroxysmal nocturnal dyspnea No lower extremity edema   Pertinent labs Lab Results  Component Value Date   CHOL 162 01/14/2018   HDL 40 01/14/2018   LDLCALC 101 (H) 01/14/2018   TRIG 104 01/14/2018   CHOLHDL 4.1 01/14/2018   Lab Results  Component Value Date   NA 141 06/20/2019   K 4.3 06/20/2019   CREATININE 1.00 06/20/2019   GFRNONAA 103 06/20/2019   GLUCOSE 101 (H) 06/20/2019   TSH 1.490 01/14/2018     The ASCVD Risk score (Arnett DK, et al., 2019) failed to calculate for the following reasons:   The 2019 ASCVD risk score is only valid for ages 80 to  80  ---------------------------------------------------------------------------------------------------   Medications: Outpatient Medications Prior to Visit  Medication Sig   albuterol (VENTOLIN HFA) 108 (90 Base) MCG/ACT inhaler Inhale 1-2 puffs into the lungs every 4 (four) hours as needed for wheezing or shortness of breath.   amLODipine (NORVASC) 2.5 MG tablet Take 1 tablet (2.5 mg total) by mouth daily.   cetirizine (ZYRTEC) 10 MG tablet Take 10 mg by mouth daily.   fluticasone (FLONASE) 50 MCG/ACT nasal spray Place 2 sprays into both nostrils daily.   lisinopril (ZESTRIL) 5 MG tablet TAKE 1 TABLET(5 MG) BY MOUTH DAILY   mupirocin ointment (BACTROBAN) 2 % Apply 1 application topically 3 (three) times daily.   Spacer/Aero-Holding Chambers (AEROCHAMBER PLUS) inhaler Use with inhaler   [DISCONTINUED] albuterol (VENTOLIN HFA) 108 (90 Base) MCG/ACT inhaler Inhale 2 puffs into the lungs every 6 (six) hours as needed for wheezing or shortness of breath.   [DISCONTINUED] cetirizine (ZYRTEC) 10 MG chewable tablet Chew 10 mg by mouth daily. (Patient not taking: Reported on 10/21/2021)   [DISCONTINUED] predniSONE (DELTASONE) 50 MG tablet Take 1 tablet (50 mg total) by mouth daily with breakfast. (Patient not taking: Reported on 10/21/2021)   [DISCONTINUED] promethazine-dextromethorphan (PROMETHAZINE-DM) 6.25-15 MG/5ML syrup Take 5 mLs by mouth 4 (four) times daily as needed for cough. (Patient not taking: Reported on 10/21/2021)   No facility-administered medications prior to visit.    Review of Systems  Constitutional:  Negative for appetite change, chills and fever.  Respiratory:  Negative for chest tightness, shortness of breath and wheezing.   Cardiovascular:  Negative for chest pain and palpitations.  Gastrointestinal:  Negative for abdominal pain, nausea and vomiting.      Objective    BP 138/74 (BP Location: Right Arm, Patient Position: Sitting, Cuff Size: Large)   Pulse 74   Temp 98.1 F  (36.7 C) (Oral)   Resp 16   Ht 7' 0.02" (2.134 m)   Wt (!) 362 lb (164.2 kg)   SpO2 100% Comment: room air  BMI 36.06 kg/m    Physical Exam   General appearance: Mildly obese male, cooperative and in no acute distress Head: Normocephalic, without obvious abnormality, atraumatic Respiratory: Respirations even and unlabored, normal respiratory rate Extremities: All extremities are intact.  Skin: Skin color, texture, turgor normal. No rashes seen  Psych: Appropriate mood and affect. Neurologic: Mental status: Alert, oriented to person, place, and time, thought content appropriate.    Assessment & Plan     1. Hypertension, unspecified type Well controlled.   - Lipid panel - Renal function panel - Hemoglobin A1c  Refill    - lisinopril (ZESTRIL) 5 MG tablet; Take 1 tablet (5 mg total) by mouth daily.  Dispense: 90 tablet; Refill: 4    - amLODipine (NORVASC) 2.5 MG tablet; Take 1 tablet (2.5 mg total) by mouth daily.  Dispense: 90 tablet; Refill: 4  Refill - cetirizine (ZYRTEC) 10 MG tablet; Take 10 mg by mouth daily.      The entirety of the information documented in the History of Present Illness, Review of Systems and Physical Exam were personally obtained by me. Portions of this information were initially documented by the CMA and reviewed by me for thoroughness and accuracy.     [name], MD  [facility] [phone] (phone) [phone] (fax)  [facility]

## 2021-10-23 DIAGNOSIS — I10 Essential (primary) hypertension: Secondary | ICD-10-CM | POA: Diagnosis not present

## 2021-10-24 LAB — LIPID PANEL
Chol/HDL Ratio: 2.9 ratio (ref 0.0–5.0)
Cholesterol, Total: 153 mg/dL (ref 100–199)
HDL: 52 mg/dL (ref >39)
LDL Chol Calc (NIH): 91 mg/dL (ref 0–99)
Triglycerides: 48 mg/dL (ref 0–149)
VLDL Cholesterol Cal: 10 mg/dL (ref 5–40)

## 2021-10-24 LAB — RENAL FUNCTION PANEL
Albumin: 4.4 g/dL (ref 4.1–5.2)
BUN/Creatinine Ratio: 13 (ref 9–20)
BUN: 12 mg/dL (ref 6–20)
CO2: 22 mmol/L (ref 20–29)
Calcium: 9.1 mg/dL (ref 8.7–10.2)
Chloride: 103 mmol/L (ref 96–106)
Creatinine, Ser: 0.96 mg/dL (ref 0.76–1.27)
Glucose: 91 mg/dL (ref 70–99)
Phosphorus: 3.8 mg/dL (ref 2.8–4.1)
Potassium: 4.1 mmol/L (ref 3.5–5.2)
Sodium: 140 mmol/L (ref 134–144)
eGFR: 109 mL/min/{1.73_m2} (ref >59)

## 2021-10-24 LAB — HEMOGLOBIN A1C
Est. average glucose Bld gHb Est-mCnc: 103 mg/dL
Hgb A1c MFr Bld: 5.2 % (ref 4.8–5.6)

## 2021-12-30 DIAGNOSIS — L089 Local infection of the skin and subcutaneous tissue, unspecified: Secondary | ICD-10-CM | POA: Diagnosis not present

## 2021-12-30 DIAGNOSIS — T148XXA Other injury of unspecified body region, initial encounter: Secondary | ICD-10-CM | POA: Diagnosis not present

## 2021-12-30 DIAGNOSIS — L03116 Cellulitis of left lower limb: Secondary | ICD-10-CM | POA: Diagnosis not present

## 2022-01-02 ENCOUNTER — Encounter: Payer: Self-pay | Admitting: Family Medicine

## 2022-01-02 ENCOUNTER — Ambulatory Visit: Payer: BC Managed Care – PPO | Admitting: Family Medicine

## 2022-01-02 VITALS — BP 129/72 | HR 89 | Resp 16 | Wt 358.0 lb

## 2022-01-02 DIAGNOSIS — L03116 Cellulitis of left lower limb: Secondary | ICD-10-CM | POA: Diagnosis not present

## 2022-01-02 DIAGNOSIS — I10 Essential (primary) hypertension: Secondary | ICD-10-CM

## 2022-01-02 DIAGNOSIS — Z6835 Body mass index (BMI) 35.0-35.9, adult: Secondary | ICD-10-CM | POA: Diagnosis not present

## 2022-01-02 MED ORDER — ALBUTEROL SULFATE HFA 108 (90 BASE) MCG/ACT IN AERS: 1.0000 | INHALATION_SPRAY | RESPIRATORY_TRACT | 11 refills | Status: AC | PRN

## 2022-01-02 MED ORDER — AMOXICILLIN-POT CLAVULANATE 875-125 MG PO TABS: 1.0000 | ORAL_TABLET | Freq: Two times a day (BID) | ORAL | 0 refills | Status: DC

## 2022-01-02 NOTE — Patient Instructions (Addendum)
Stop amlodipine.Stop Doxycycline Elevate leg when possible

## 2022-01-02 NOTE — Progress Notes (Signed)
Established patient visit  I,Joshua Hendrix,acting as a scribe for Joshua Durie, MD.,have documented all relevant documentation on the behalf of Joshua Durie, MD,as directed by  Joshua Durie, MD while in the presence of Joshua Durie, MD.   Patient: Joshua Hendrix   DOB: March 06, 1991   31 y.o. Male  MRN: [id number] Visit Date: 01/02/2022  Today's healthcare provider: [name], MD   Chief Complaint  Patient presents with   Follow-up   Cellulitis   Subjective    HPI  Patient comes in today for follow-up after being seen on the 17th for cellulitis at urgent care with doxycycline.  He is no better.  He is not running any fevers but the erythema in the left lower extremity persist.  He has no pain but is somewhat tender to the touch.   Follow up for cellulitis of left lower extremity:  The patient was seen by [facility] on 12/30/2021. He was prescribed Doxycycline 191m twice daily x 7 days and was advised to apply topical mupirocin to affected area 3 times a day until resolution.  -----------------------------------------------------------------------------------------  Patient is in severe pain in his ankle and leg. Patient is still taking doxycycline.    Medications: Outpatient Medications Prior to Visit  Medication Sig   albuterol (VENTOLIN HFA) 108 (90 Base) MCG/ACT inhaler Inhale 1-2 puffs into the lungs every 4 (four) hours as needed for wheezing or shortness of breath.   amLODipine (NORVASC) 2.5 MG tablet Take 1 tablet (2.5 mg total) by mouth daily.   cetirizine (ZYRTEC) 10 MG tablet Take 10 mg by mouth daily.   doxycycline (VIBRA-TABS) 100 MG tablet Take 100 mg by mouth 2 (two) times daily.   fluticasone (FLONASE) 50 MCG/ACT nasal spray Place 2 sprays into both nostrils daily.   lisinopril (ZESTRIL) 5 MG tablet Take 1 tablet (5 mg total) by mouth daily.   mupirocin ointment (BACTROBAN) 2 % Apply 1 application topically 3 (three)  times daily.   Spacer/Aero-Holding Chambers (AEROCHAMBER PLUS) inhaler Use with inhaler   No facility-administered medications prior to visit.    Review of Systems  Last CBC Lab Results  Component Value Date   WBC 4.9 01/02/2022   HGB 14.3 01/02/2022   HCT 43.9 01/02/2022   MCV 88 01/02/2022   MCH 28.5 01/02/2022   RDW 12.3 01/02/2022   PLT 171 01/02/2022       Objective    BP 129/72 (BP Location: Right Arm, Patient Position: Sitting, Cuff Size: Large)   Pulse 89   Resp 16   Wt (!) 358 lb (162.4 kg)   SpO2 99%   BMI 35.66 kg/m  BP Readings from Last 3 Encounters:  01/02/22 129/72  10/21/21 138/74  09/10/21 (!) 155/74   Wt Readings from Last 3 Encounters:  01/02/22 (!) 358 lb (162.4 kg)  10/21/21 (!) 362 lb (164.2 kg)  02/10/20 (!) 370 lb (167.8 kg)      Physical Exam Vitals reviewed.  Constitutional:      General: He is not in acute distress.    Appearance: He is well-developed.     Comments: Patient is a very large man in no acute distress.  He is 7 feet tall.  HENT:     Head: Normocephalic and atraumatic.     Right Ear: Hearing normal.     Left Ear: Hearing normal.     Nose: Nose normal.  Eyes:     General: Lids are  normal. No scleral icterus.       Right eye: No discharge.        Left eye: No discharge.     Conjunctiva/sclera: Conjunctivae normal.  Cardiovascular:     Rate and Rhythm: Normal rate and regular rhythm.     Heart sounds: Normal heart sounds.  Pulmonary:     Effort: Pulmonary effort is normal. No respiratory distress.  Skin:    General: Skin is warm and dry.     Findings: Erythema present. No lesion or rash.     Comments: There is erythema with mild tenderness to the touch of the left lower anterior leg.  There is mild swelling in the area.  Pulses are good.  There are no open wounds.  No obvious drainage.  No pustules or pointing of the wound  Neurological:     General: No focal deficit present.     Mental Status: He is alert and  oriented to person, place, and time.  Psychiatric:        Mood and Affect: Mood normal.        Speech: Speech normal.        Behavior: Behavior normal.        Thought Content: Thought content normal.        Judgment: Judgment normal.       No results found for any visits on 01/02/22.  Assessment & Plan     1. Cellulitis of left lower extremity Treat with Augmentin and stop the doxycycline.  Do not think this is a vasculitis but follow this expectantly.  May need dermatology referral.  At this time obtain CBC and sed rate. - CBC w/Diff/Platelet - Sed Rate (ESR)  2. Hypertension, unspecified type Mild lower extremity swelling we will stop his low-dose amlodipine and have him follow-up with Dr. Caryn Section in a month.  3. Class 2 severe obesity with serious comorbidity and body mass index (BMI) of 35.0 to 35.9 in adult, unspecified obesity type [facility]) Patient is very large at 7 feet but goal waist size will be less than 40 at his height.  He has not been working on weight loss or diet /lifestyle   No follow-ups on file.      I, Joshua Durie, MD, have reviewed all documentation for this visit. The documentation on 01/05/22 for the exam, diagnosis, procedures, and orders are all accurate and complete.    Maven Rosander Cranford Mon, MD  [facility] [phone] (phone) [phone] (fax)  [place]

## 2022-01-03 LAB — CBC WITH DIFFERENTIAL/PLATELET
Basophils Absolute: 0 10*3/uL (ref 0.0–0.2)
Basos: 1 %
EOS (ABSOLUTE): 0.2 10*3/uL (ref 0.0–0.4)
Eos: 5 %
Hematocrit: 43.9 % (ref 37.5–51.0)
Hemoglobin: 14.3 g/dL (ref 13.0–17.7)
Immature Grans (Abs): 0 10*3/uL (ref 0.0–0.1)
Immature Granulocytes: 0 %
Lymphocytes Absolute: 0.9 10*3/uL (ref 0.7–3.1)
Lymphs: 18 %
MCH: 28.5 pg (ref 26.6–33.0)
MCHC: 32.6 g/dL (ref 31.5–35.7)
MCV: 88 fL (ref 79–97)
Monocytes Absolute: 0.6 10*3/uL (ref 0.1–0.9)
Monocytes: 11 %
Neutrophils Absolute: 3.2 10*3/uL (ref 1.4–7.0)
Neutrophils: 65 %
Platelets: 171 10*3/uL (ref 150–450)
RBC: 5.02 x10E6/uL (ref 4.14–5.80)
RDW: 12.3 % (ref 11.6–15.4)
WBC: 4.9 10*3/uL (ref 3.4–10.8)

## 2022-01-03 LAB — SEDIMENTATION RATE: Sed Rate: 6 mm/hr (ref 0–15)

## 2022-02-03 ENCOUNTER — Ambulatory Visit: Payer: BC Managed Care – PPO | Admitting: Family Medicine

## 2022-02-03 ENCOUNTER — Encounter: Payer: Self-pay | Admitting: Family Medicine

## 2022-02-03 VITALS — BP 117/58 | HR 63 | Temp 98.2°F | Resp 16 | Wt 349.0 lb

## 2022-02-03 DIAGNOSIS — L03116 Cellulitis of left lower limb: Secondary | ICD-10-CM

## 2022-02-03 DIAGNOSIS — I10 Essential (primary) hypertension: Secondary | ICD-10-CM

## 2022-02-03 NOTE — Progress Notes (Signed)
I,Joshua Hendrix,acting as a scribe for [name], MD.,have documented all relevant documentation on the behalf of [name], MD,as directed by  [name], MD while in the presence of [name], MD.    Established patient visit   Patient: Joshua Hendrix   DOB: [birth year]   31 y.o. Male  MRN: [id number] Visit Date: 02/03/2022  Today's healthcare provider: [name], MD   Chief Complaint  Patient presents with   Hypertension   Cellulitis   Subjective    HPI  Hypertension, follow-up  BP Readings from Last 3 Encounters:  02/03/22 (!) 117/58  01/02/22 129/72  10/21/21 138/74   Wt Readings from Last 3 Encounters:  02/03/22 (!) 349 lb (158.3 kg)  01/02/22 (!) 358 lb (162.4 kg)  10/21/21 (!) 362 lb (164.2 kg)     He was last seen for hypertension 1  month  ago (seen by Dr. Sullivan Lone).  BP at that visit was 129/72. Management since that visit includes stopping his low-dose amlodipine due to mild lower extremity edema. He states that over the last several months he has dramatically reduced sodium intake and feels he can keep BP much better controlled with healthy diet.  ---------------------------------------------------------------------------------------------------   Follow up for Cellulitis of left lower extremity:  The patient was last seen for this 1  month  ago. No changes were made during that visit. Labs were ordered (CBC and Sed Rate) and results were normal. Prior to that visit he had been to Urgent Care and prescribed doxycycline. Dr. Sullivan Lone change antibiotic to Augment and patient reports rapid improved after antibiotic change. He states he think infection came from walking out in woods where he often gets minor scratches and insect bites.   He reports good compliance with treatment. He feels that condition is Improved. Patient reports infection is 95% improved. Is no longer painful at  He is not having side effects.    -----------------------------------------------------------------------------------------   Medications: Outpatient Medications Prior to Visit  Medication Sig   albuterol (VENTOLIN HFA) 108 (90 Base) MCG/ACT inhaler Inhale 1-2 puffs into the lungs every 4 (four) hours as needed for wheezing or shortness of breath.   cetirizine (ZYRTEC) 10 MG tablet Take 10 mg by mouth daily.   fluticasone (FLONASE) 50 MCG/ACT nasal spray Place 2 sprays into both nostrils daily.   lisinopril (ZESTRIL) 5 MG tablet Take 1 tablet (5 mg total) by mouth daily.   mupirocin ointment (BACTROBAN) 2 % Apply 1 application topically 3 (three) times daily.   Spacer/Aero-Holding Hendrix (AEROCHAMBER PLUS) inhaler Use with inhaler   amLODipine (NORVASC) 2.5 MG tablet Take 1 tablet (2.5 mg total) by mouth daily. (Patient not taking: Reported on 02/03/2022)   [DISCONTINUED] amoxicillin-clavulanate (AUGMENTIN) 875-125 MG tablet Take 1 tablet by mouth 2 (two) times daily. (Patient not taking: Reported on 02/03/2022)   [DISCONTINUED] doxycycline (VIBRA-TABS) 100 MG tablet Take 100 mg by mouth 2 (two) times daily. (Patient not taking: Reported on 02/03/2022)   No facility-administered medications prior to visit.    Review of Systems  Constitutional:  Negative for appetite change, chills and fever.  Respiratory:  Negative for chest tightness, shortness of breath and wheezing.   Cardiovascular:  Negative for chest pain and palpitations.  Gastrointestinal:  Negative for abdominal pain, nausea and vomiting.       Objective    BP (!) 117/58 (BP Location: Right Arm, Patient Position: Sitting, Cuff Size: Large)   Pulse 63   Temp 98.2 F (36.8  C) (Oral)   Resp 16   Wt (!) 349 lb (158.3 kg)   SpO2 100% Comment: room air  BMI 34.76 kg/m    Physical Exam   Several small lesion from insect bits scattered around anterior left lower leg. Trace edema. Previously areas or erythema are now vague faded brownish color. Not at all  tender to touch.   Assessment & Plan     1. Hypertension, unspecified type Well controlled since dramatically reducing sodium consumption. Now  off of amlodipine. Will continue lisinopril for the time being and reassess in 3-4 months.   2. Cellulitis of left lower extremity Rapid, dramatic improvement with 7 days of Augmentin. No sign of active infection at this time. Call if redness, soreness, or swelling reappears.       The entirety of the information documented in the History of Present Illness, Review of Systems and Physical Exam were personally obtained by me. Portions of this information were initially documented by the CMA and reviewed by me for thoroughness and accuracy.     [name], MD  [facility] [phone] (phone) [phone] (fax)  [facility]

## 2022-05-26 ENCOUNTER — Encounter: Payer: Self-pay | Admitting: Family Medicine

## 2022-05-26 ENCOUNTER — Ambulatory Visit: Payer: BC Managed Care – PPO | Admitting: Family Medicine

## 2022-05-26 VITALS — BP 123/74 | HR 66 | Resp 16 | Wt 344.0 lb

## 2022-05-26 DIAGNOSIS — I10 Essential (primary) hypertension: Secondary | ICD-10-CM | POA: Diagnosis not present

## 2022-05-26 DIAGNOSIS — Z23 Encounter for immunization: Secondary | ICD-10-CM

## 2022-05-26 NOTE — Progress Notes (Signed)
I,[name],acting as a scribe for Lelon Huh, MD.,have documented all relevant documentation on the behalf of Lelon Huh, MD,as directed by  Lelon Huh, MD while in the presence of Lelon Huh, MD.   Established patient visit   Patient: Joshua Hendrix   DOB: 01/03/1991   31 y.o. Male  MRN: [id number] Visit Date: 05/26/2022  Today's healthcare provider: Lelon Huh, MD   Chief Complaint  Patient presents with   Follow-up   Hypertension   Subjective    HPI  Hypertension, follow-up  BP Readings from Last 3 Encounters:  05/26/22 123/74  02/03/22 (!) 117/58  01/02/22 129/72   Wt Readings from Last 3 Encounters:  05/26/22 (!) 344 lb (156 kg)  02/03/22 (!) 349 lb (158.3 kg)  01/02/22 (!) 358 lb (162.4 kg)     He was last seen for hypertension 3 months ago.  Management since that visit includes; continue lisinopril for the time being and reassess in 3-4 months.   Outside blood pressures are 130/75.  Pertinent labs Lab Results  Component Value Date   CHOL 153 10/23/2021   HDL 52 10/23/2021   LDLCALC 91 10/23/2021   TRIG 48 10/23/2021   CHOLHDL 2.9 10/23/2021   Lab Results  Component Value Date   NA 140 10/23/2021   K 4.1 10/23/2021   CREATININE 0.96 10/23/2021   EGFR 109 10/23/2021   GLUCOSE 91 10/23/2021   TSH 1.490 01/14/2018     The ASCVD Risk score (Arnett DK, et al., 2019) failed to calculate for the following reasons:   The 2019 ASCVD risk score is only valid for ages 72 to 37  ---------------------------------------------------------------------------------------------------   Medications: Outpatient Medications Prior to Visit  Medication Sig   albuterol (VENTOLIN HFA) 108 (90 Base) MCG/ACT inhaler Inhale 1-2 puffs into the lungs every 4 (four) hours as needed for wheezing or shortness of breath.   cetirizine (ZYRTEC) 10 MG tablet Take 10 mg by mouth daily.   fluticasone (FLONASE) 50 MCG/ACT nasal spray Place 2 sprays into both  nostrils daily.   lisinopril (ZESTRIL) 5 MG tablet Take 1 tablet (5 mg total) by mouth daily.   mupirocin ointment (BACTROBAN) 2 % Apply 1 application topically 3 (three) times daily.   Spacer/Aero-Holding Chambers (AEROCHAMBER PLUS) inhaler Use with inhaler   No facility-administered medications prior to visit.    Review of Systems  Constitutional:  Negative for appetite change, chills and fever.  Respiratory:  Negative for chest tightness, shortness of breath and wheezing.   Cardiovascular:  Negative for chest pain and palpitations.  Gastrointestinal:  Negative for abdominal pain, nausea and vomiting.       Objective    BP 123/74 (BP Location: Left Arm, Patient Position: Sitting, Cuff Size: Large)   Pulse 66   Resp 16   Wt (!) 344 lb (156 kg)   SpO2 98%   BMI 34.26 kg/m    Physical Exam  General appearance: Mildly obese male, cooperative and in no acute distress Head: Normocephalic, without obvious abnormality, atraumatic Respiratory: Respirations even and unlabored, normal respiratory rate Extremities: All extremities are intact.  Skin: Skin color, texture, turgor normal. No rashes seen  Psych: Appropriate mood and affect. Neurologic: Mental status: Alert, oriented to person, place, and time, thought content appropriate.   Assessment & Plan     1. Hypertension, unspecified type Very well controlled. Off amlodipine since improving diet and losing weight. Continues to be physically active. Continue current medications.    Follow up  with CPE in May  2. Need for immunization against influenza  - Flu Vaccine QUAD 33moIM (Fluarix, Fluzone & Alfiuria Quad PF)      The entirety of the information documented in the History of Present Illness, Review of Systems and Physical Exam were personally obtained by me. Portions of this information were initially documented by the CMA and reviewed by me for thoroughness and accuracy.     DLelon Huh MD  [phone](phone) [phone](fax)  [place]

## 2022-10-21 NOTE — Progress Notes (Unsigned)
Joshua Hendrix,acting as a scribe for [name], MD.,have documented all relevant documentation on the behalf of [name], MD,as directed by  [name], MD   Complete physical exam   Patient: Joshua Hendrix   DOB: 1990-09-21   32 y.o. Male  MRN: [id number] Visit Date: 10/22/2022  Today's healthcare provider: [name], MD   Chief Complaint  Patient presents with   Annual Exam   Subjective    Joshua Hendrix is a 32 y.o. male who presents today for a complete physical exam.  He reports consuming a general diet. He exercises by running, basketball and going to the gym. He generally feels well. He reports sleeping well. He does not have additional problems to discuss today.    Patient has been off of his blood pressure for 3 months and has been checking his pressure periodically and readings have been good. Has been exercising, lost a little bit of weight, and is under much less stress.   Past Medical History:  Diagnosis Date   Amblyopia    Hypertension    Past Surgical History:  Procedure Laterality Date   CYST REMOVAL NECK  12/11/2009   TONSILLECTOMY  1997   Social History   Socioeconomic History   Marital status: Married    Spouse name: Not on file   Number of children: Not on file   Years of education: Not on file   Highest education level: Not on file  Occupational History   Not on file  Tobacco Use   Smoking status: Former   Smokeless tobacco: Never   Tobacco comments:    social smoker only  Vaping Use   Vaping Use: Never used  Substance and Sexual Activity   Alcohol use: No    Alcohol/week: 0.0 standard drinks of alcohol   Drug use: No   Sexual activity: Not on file  Other Topics Concern   Not on file  Social History Narrative   Not on file   Social Determinants of Health   Financial Resource Strain: Not on file  Food Insecurity: Not on file  Transportation Needs: Not on file  Physical Activity: Not on file  Stress: Not on file   Social Connections: Not on file  Intimate Partner Violence: Not on file   Family Status  Relation Name Status   Mother  Alive   Father  Alive       Fatty liver   MGF  Deceased at age 54   Neg Hx  (Not Specified)   Family History  Problem Relation Age of Onset   Heart Problems Mother        Heart valve problem   Hypertension Father    Hyperlipidemia Father    Heart attack Maternal Grandfather        3-4 heart attacks occured in his 57's   Stroke Maternal Grandfather    Cancer Neg Hx    Diabetes Neg Hx    Prostate cancer Neg Hx    Kidney cancer Neg Hx    Bladder Cancer Neg Hx    No Known Allergies  Patient Care Team: Malva Limes, MD as PCP - General (Family Medicine) Felecia Shelling, DPM as Consulting Physician (Podiatry)   Medications: Outpatient Medications Prior to Visit  Medication Sig   cetirizine (ZYRTEC) 10 MG tablet Take 10 mg by mouth daily.   Spacer/Aero-Holding Chambers (AEROCHAMBER PLUS) inhaler Use with inhaler   albuterol (VENTOLIN HFA) 108 (90 Base) MCG/ACT inhaler Inhale 1-2  puffs into the lungs every 4 (four) hours as needed for wheezing or shortness of breath. (Patient not taking: Reported on 10/22/2022)   fluticasone (FLONASE) 50 MCG/ACT nasal spray Place 2 sprays into both nostrils daily. (Patient not taking: Reported on 10/22/2022)   lisinopril (ZESTRIL) 5 MG tablet Take 1 tablet (5 mg total) by mouth daily. (Patient not taking: Reported on 10/22/2022)   mupirocin ointment (BACTROBAN) 2 % Apply 1 application topically 3 (three) times daily. (Patient not taking: Reported on 10/22/2022)   No facility-administered medications prior to visit.    Review of Systems  Constitutional: Negative.   HENT: Negative.    Eyes: Negative.   Respiratory: Negative.    Cardiovascular: Negative.   Gastrointestinal: Negative.   Endocrine: Negative.   Genitourinary: Negative.   Musculoskeletal: Negative.   Skin: Negative.   Allergic/Immunologic: Negative.    Neurological: Negative.   Hematological: Negative.   Psychiatric/Behavioral: Negative.       Objective    BP 129/77 (BP Location: Left Arm, Patient Position: Sitting, Cuff Size: Large)   Pulse 75   Temp 98 F (36.7 C) (Oral)   Ht 7' (2.134 m)   Wt (!) 348 lb (157.9 kg)   SpO2 99%   BMI 34.68 kg/m    Physical Exam   General Appearance:    Overweight male. Alert, cooperative, in no acute distress, appears stated age  Head:    Normocephalic, without obvious abnormality, atraumatic  Eyes:    PERRL, conjunctiva/corneas clear, EOM's intact, fundi    benign, both eyes       Ears:    Normal TM's and external ear canals, both ears  Nose:   Nares normal, septum midline, mucosa normal, no drainage   or sinus tenderness  Throat:   Lips, mucosa, and tongue normal; teeth and gums normal  Neck:   Supple, symmetrical, trachea midline, no adenopathy;       thyroid:  No enlargement/tenderness/nodules; no carotid   bruit or JVD  Back:     Symmetric, no curvature, ROM normal, no CVA tenderness  Lungs:     Clear to auscultation bilaterally, respirations unlabored  Chest wall:    No tenderness or deformity  Heart:    Normal heart rate. Normal rhythm. No murmurs, rubs, or gallops.  S1 and S2 normal  Abdomen:     Soft, non-tender, bowel sounds active all four quadrants,    no masses, no organomegaly  Genitalia:    deferred  Rectal:    deferred  Extremities:   All extremities are intact. No cyanosis or edema  Pulses:   2+ and symmetric all extremities  Skin:   Skin color, texture, turgor normal, no rashes or lesions  Lymph nodes:   Cervical, supraclavicular, and axillary nodes normal  Neurologic:   CNII-XII intact. Normal strength, sensation and reflexes      throughout     Last depression screening scores    10/22/2022    8:38 AM 10/21/2021    4:26 PM 06/20/2019    8:31 AM  PHQ 2/9 Scores  PHQ - 2 Score 0 0 0  PHQ- 9 Score 0 0    Last fall risk screening    10/22/2022    8:38 AM  Fall  Risk   Falls in the past year? 0  Number falls in past yr: 0  Injury with Fall? 0   Last Audit-C alcohol use screening    10/22/2022    8:38 AM  Alcohol Use Disorder Test (AUDIT)  1. How often do you have a drink containing alcohol? 0  2. How many drinks containing alcohol do you have on a typical day when you are drinking? 0  3. How often do you have six or more drinks on one occasion? 0  AUDIT-C Score 0   A score of 3 or more in women, and 4 or more in men indicates increased risk for alcohol abuse, EXCEPT if all of the points are from question 1   No results found for any visits on 10/22/22.  Assessment & Plan    Routine Health Maintenance and Physical Exam  Exercise Activities and Dietary recommendations  Goals   None     Immunization History  Administered Date(s) Administered   DTaP 10/19/1991, 12/22/1991, 02/28/1992, 03/15/1993, 09/26/1996   HIB (PRP-OMP) 10/19/1991, 12/22/1991, 02/28/1992, 03/15/1993   HPV 9-valent 03/27/2015, 05/04/2015, 12/06/2015   Hepatitis A 07/07/2006, 02/28/2009   Hepatitis B 07/18/2009, 08/22/2009, 11/20/2009   IPV 10/19/1991, 12/22/1991, 03/15/1993, 09/26/1996   Influenza,inj,Quad PF,6+ Mos 03/16/2018, 06/20/2019, 05/26/2022   MMR 11/23/1992, 09/26/1996   Meningococcal Conjugate 02/28/2009   PFIZER Comirnaty(Gray Top)Covid-19 Tri-Sucrose Vaccine 10/28/2020   Td 11/26/2016   Tdap 07/07/2006   Varicella 02/26/1994, 07/07/2006    Health Maintenance  Topic Date Due   Hepatitis C Screening  Never done   COVID-19 Vaccine (2 - 2023-24 season) 02/14/2022   INFLUENZA VACCINE  01/15/2023   DTaP/Tdap/Td (8 - Td or Tdap) 11/27/2026   HPV VACCINES  Completed   HIV Screening  Completed    Discussed health benefits of physical activity, and encouraged him to engage in regular exercise appropriate for his age and condition.  He states he recently had several vaccines including a Tdap on 11/12/2022 at local pharmacy in preparation for upcoming  mission trip to [place]. Will have CMA check [facility] for records.   2. Hypertension, unspecified type Now off of lisinopril since losing weight and reduced stress level over the last year.   - CBC - Comprehensive metabolic panel - Lipid panel     The entirety of the information documented in the History of Present Illness, Review of Systems and Physical Exam were personally obtained by me. Portions of this information were initially documented by the CMA and reviewed by me for thoroughness and accuracy.     [name], MD  [facility] [phone] (phone) [phone] (fax)  [facility]

## 2022-10-22 ENCOUNTER — Ambulatory Visit (INDEPENDENT_AMBULATORY_CARE_PROVIDER_SITE_OTHER): Payer: BC Managed Care – PPO | Admitting: Family Medicine

## 2022-10-22 VITALS — BP 129/77 | HR 75 | Temp 98.0°F | Ht >= 80 in | Wt 348.0 lb

## 2022-10-22 DIAGNOSIS — J45909 Unspecified asthma, uncomplicated: Secondary | ICD-10-CM | POA: Insufficient documentation

## 2022-10-22 DIAGNOSIS — I10 Essential (primary) hypertension: Secondary | ICD-10-CM | POA: Diagnosis not present

## 2022-10-22 DIAGNOSIS — Z0000 Encounter for general adult medical examination without abnormal findings: Secondary | ICD-10-CM | POA: Diagnosis not present

## 2022-10-23 LAB — COMPREHENSIVE METABOLIC PANEL
ALT: 24 IU/L (ref 0–44)
AST: 25 IU/L (ref 0–40)
Albumin/Globulin Ratio: 2.4 — ABNORMAL HIGH (ref 1.2–2.2)
Albumin: 4.5 g/dL (ref 4.1–5.1)
Alkaline Phosphatase: 70 IU/L (ref 44–121)
BUN/Creatinine Ratio: 9 (ref 9–20)
BUN: 9 mg/dL (ref 6–20)
Bilirubin Total: 0.5 mg/dL (ref 0.0–1.2)
CO2: 21 mmol/L (ref 20–29)
Calcium: 9.5 mg/dL (ref 8.7–10.2)
Chloride: 103 mmol/L (ref 96–106)
Creatinine, Ser: 1.01 mg/dL (ref 0.76–1.27)
Globulin, Total: 1.9 g/dL (ref 1.5–4.5)
Glucose: 85 mg/dL (ref 70–99)
Potassium: 4.5 mmol/L (ref 3.5–5.2)
Sodium: 141 mmol/L (ref 134–144)
Total Protein: 6.4 g/dL (ref 6.0–8.5)
eGFR: 102 mL/min/{1.73_m2} (ref >59)

## 2022-10-23 LAB — CBC
Hematocrit: 44.6 % (ref 37.5–51.0)
Hemoglobin: 14.8 g/dL (ref 13.0–17.7)
MCH: 28.4 pg (ref 26.6–33.0)
MCHC: 33.2 g/dL (ref 31.5–35.7)
MCV: 85 fL (ref 79–97)
Platelets: 183 10*3/uL (ref 150–450)
RBC: 5.22 x10E6/uL (ref 4.14–5.80)
RDW: 12.6 % (ref 11.6–15.4)
WBC: 4.1 10*3/uL (ref 3.4–10.8)

## 2022-10-23 LAB — LIPID PANEL
Chol/HDL Ratio: 3.1 ratio (ref 0.0–5.0)
Cholesterol, Total: 152 mg/dL (ref 100–199)
HDL: 49 mg/dL (ref >39)
LDL Chol Calc (NIH): 91 mg/dL (ref 0–99)
Triglycerides: 61 mg/dL (ref 0–149)
VLDL Cholesterol Cal: 12 mg/dL (ref 5–40)

## 2023-01-11 ENCOUNTER — Other Ambulatory Visit: Payer: Self-pay | Admitting: Family Medicine

## 2023-01-11 DIAGNOSIS — I10 Essential (primary) hypertension: Secondary | ICD-10-CM

## 2023-02-25 IMAGING — CR DG CHEST 2V
1 series · 2 of 2 positions shown · non-contrast
Comparison: X-ray chest 06/26/2020.

CLINICAL DATA: Cough and fever.

EXAM:
CHEST - 2 VIEW

[Series 1: dg chest 2 view · 0.14mm/px · 2 of 2 slices shown]
[im 1/2]
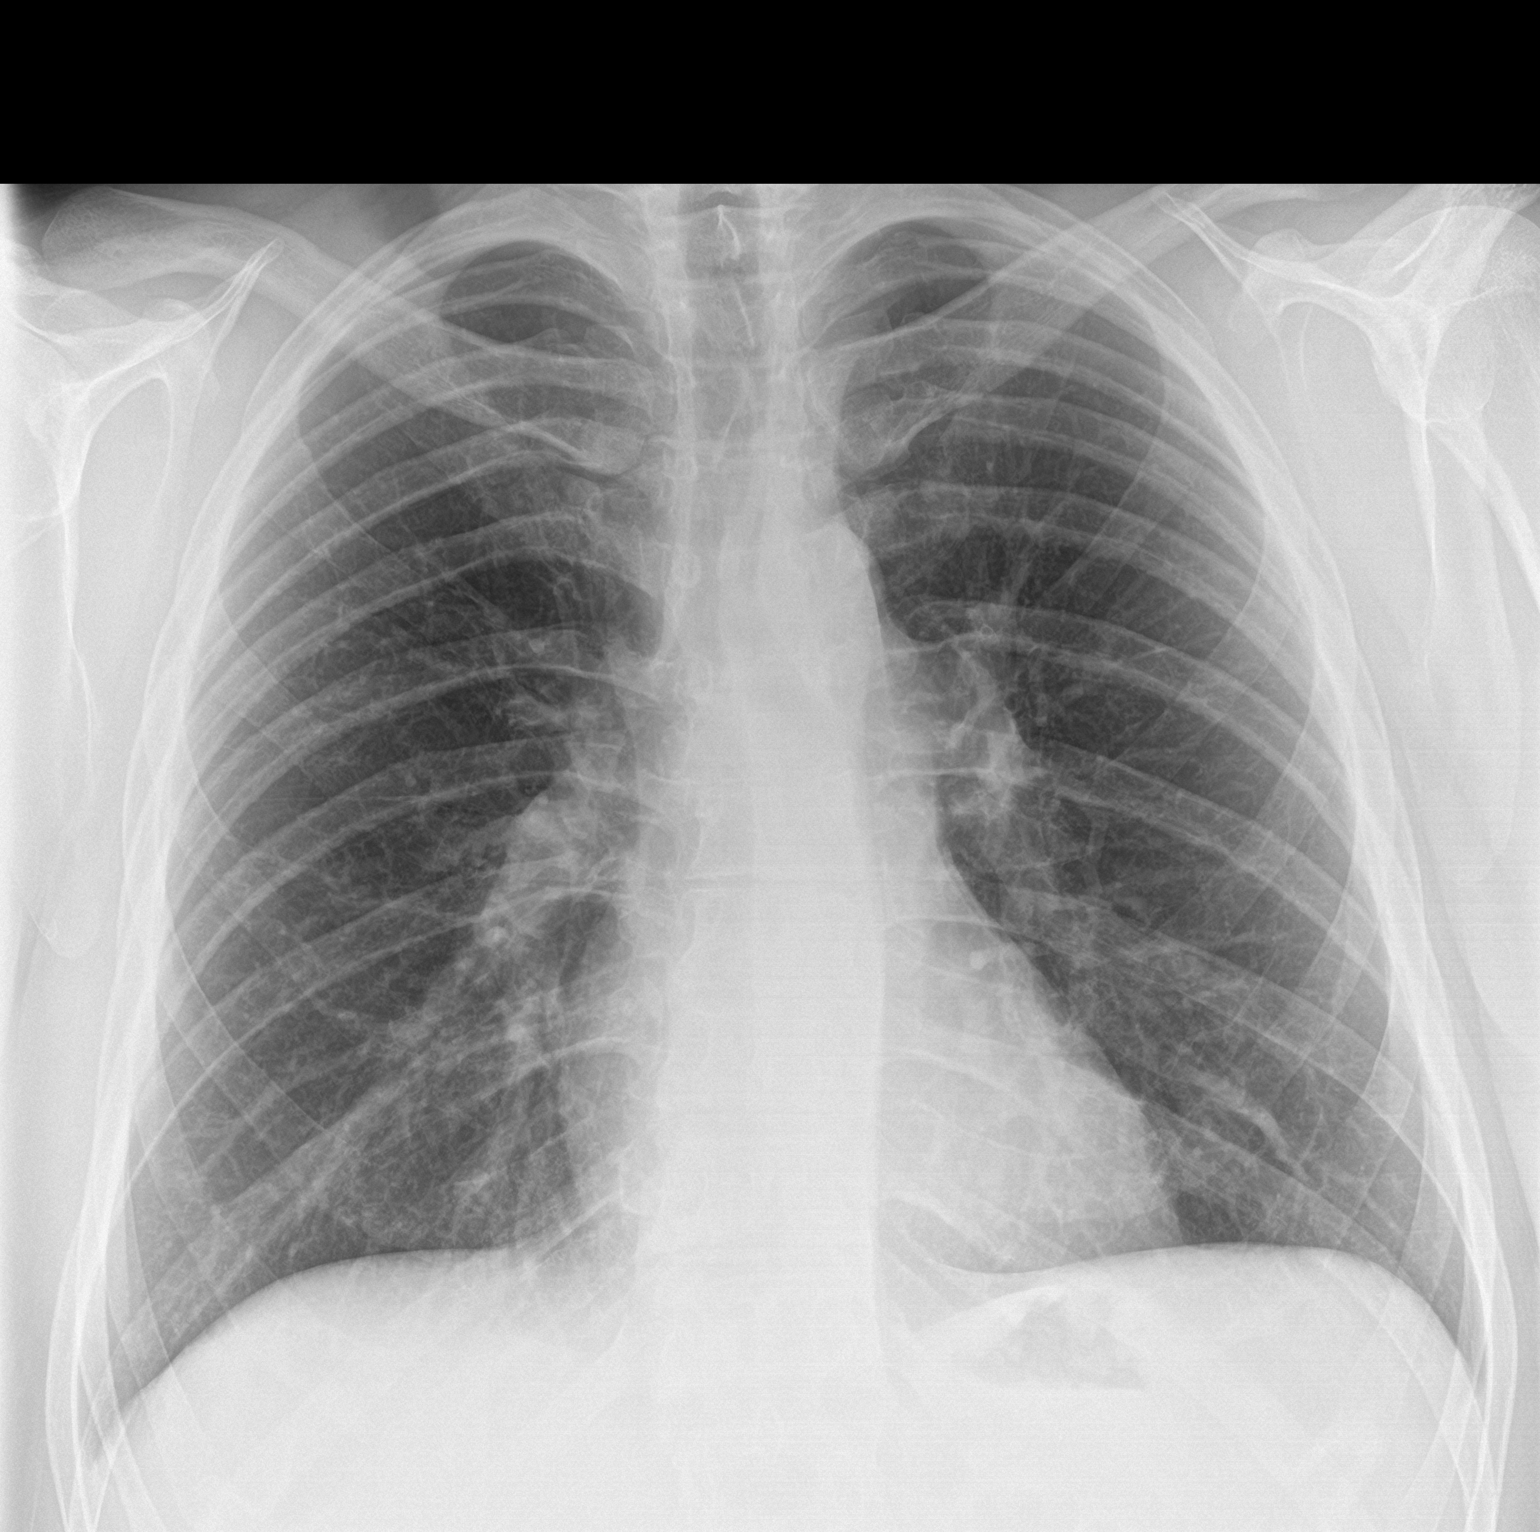
[im 2/2]
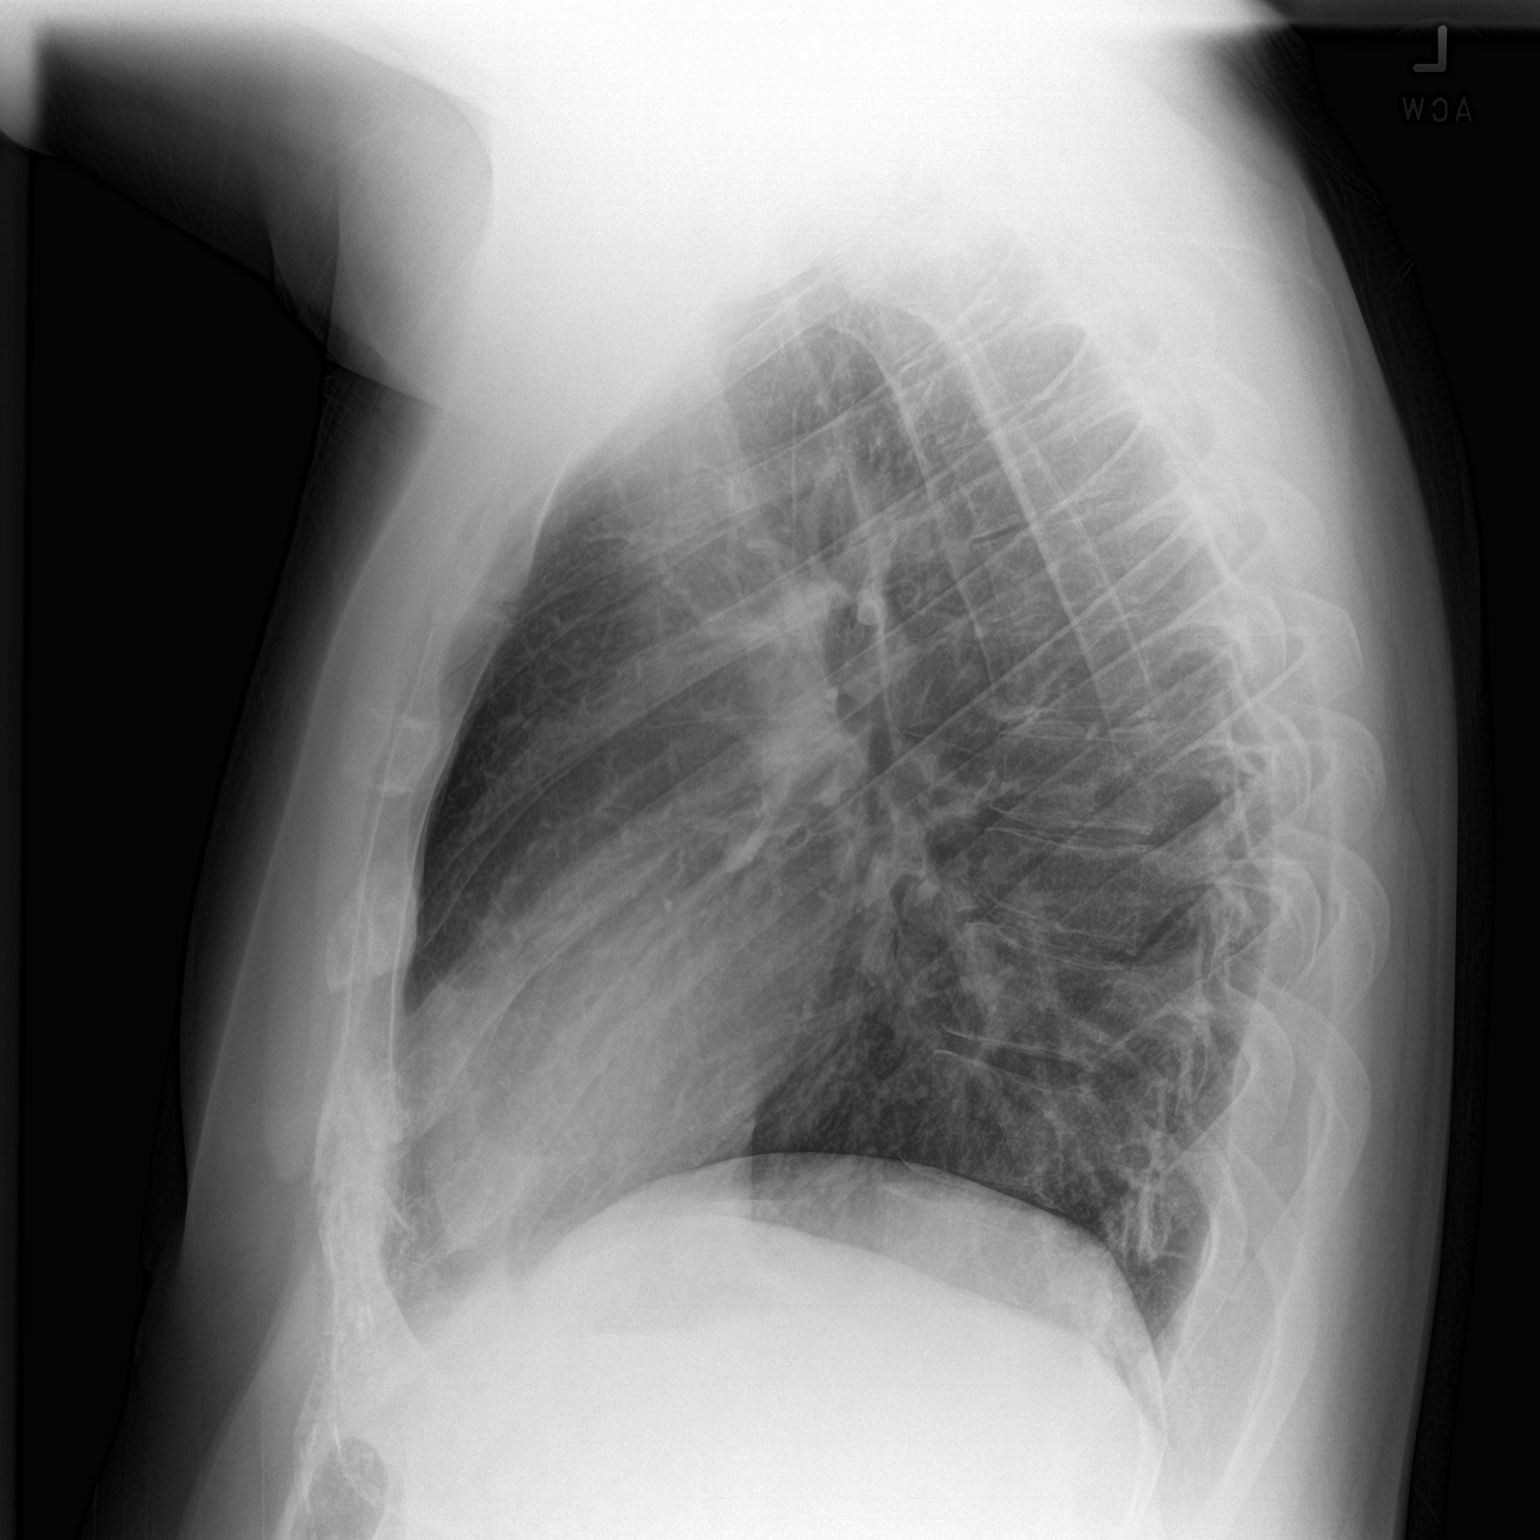

[2 of 2 positions shown; findings below may reference images not displayed]

FINDINGS: The cardiomediastinal silhouette is unchanged with normal heart
size. Slightly prominent interstitial markings in the left greater
than right lung bases are less conspicuous than on the prior study.
No airspace consolidation, edema, pleural effusion, or pneumothorax
is identified. No acute osseous abnormality is seen.
IMPRESSION: No active cardiopulmonary disease.

## 2023-10-23 ENCOUNTER — Ambulatory Visit: Payer: Self-pay

## 2023-10-23 ENCOUNTER — Ambulatory Visit: Admitting: Family Medicine

## 2023-10-23 VITALS — BP 146/80 | HR 96 | Resp 16 | Ht >= 80 in | Wt 323.0 lb

## 2023-10-23 DIAGNOSIS — K219 Gastro-esophageal reflux disease without esophagitis: Secondary | ICD-10-CM | POA: Diagnosis not present

## 2023-10-23 DIAGNOSIS — R1010 Upper abdominal pain, unspecified: Secondary | ICD-10-CM

## 2023-10-23 LAB — POCT URINALYSIS DIPSTICK
Bilirubin, UA: NEGATIVE
Blood, UA: NEGATIVE
Glucose, UA: NEGATIVE
Ketones, UA: NEGATIVE
Leukocytes, UA: NEGATIVE
Nitrite, UA: NEGATIVE
Protein, UA: NEGATIVE
Spec Grav, UA: 1.02 (ref 1.010–1.025)
Urobilinogen, UA: 0.2 U/dL
pH, UA: 5 (ref 5.0–8.0)

## 2023-10-23 NOTE — Telephone Encounter (Signed)
 Reason for Triage: patient experiencing pain in the mid back and has been having this concern for 4 weeks or more it comes and goes and in the mornings it worse a radiating pain    Chief Complaint: Upper abdominal pain at ribs. Comes and goes.  Symptoms: Above Frequency: 4 weeks Pertinent Negatives: Patient denies any other symptoms. Disposition: [] ED /[] Urgent Care (no appt availability in office) / [x] Appointment(In office/virtual)/ []  [facility]/ [] Home Care/ [] Refused Recommended Disposition /[] [facility]/ []  Follow-up with PCP Additional Notes: Agrees with appointment.  Reason for Disposition  [1] MILD-MODERATE pain AND [2] not relieved by antacid medicine  Answer Assessment - Initial Assessment Questions 1. LOCATION: "Where does it hurt?"      Bottom of rib cage 2. RADIATION: "Does the pain shoot anywhere else?" (e.g., chest, back)     side 3. ONSET: "When did the pain begin?" (e.g., minutes, hours or days ago)      4 weeks 4. SUDDEN: "Gradual or sudden onset?"     Gradual 5. PATTERN "Does the pain come and go, or is it constant?"    - If it comes and goes: "How long does it last?" "Do you have pain now?"     (Note: Comes and goes means the pain is intermittent. It goes away completely between bouts.)    - If constant: "Is it getting better, staying the same, or getting worse?"      (Note: Constant means the pain never goes away completely; most serious pain is constant and gets worse.)      Comes and goes 6. SEVERITY: "How bad is the pain?"  (e.g., Scale 1-10; mild, moderate, or severe)    - MILD (1-3): Doesn't interfere with normal activities, abdomen soft and not tender to touch..     - MODERATE (4-7): Interferes with normal activities or awakens from sleep, abdomen tender to touch.     - SEVERE (8-10): Excruciating pain, doubled over, unable to do any normal activities.       Now - 5 7. RECURRENT SYMPTOM: "Have you ever had this type of stomach pain  before?" If Yes, ask: "When was the last time?" and "What happened that time?"      no 8. AGGRAVATING FACTORS: "Does anything seem to cause this pain?" (e.g., foods, stress, alcohol)     no 9. CARDIAC SYMPTOMS: "Do you have any of the following symptoms: chest pain, difficulty breathing, sweating, nausea?"     no 10. OTHER SYMPTOMS: "Do you have any other symptoms?" (e.g., back pain, diarrhea, fever, urination pain, vomiting)       no 11. PREGNANCY: "Is there any chance you are pregnant?" "When was your last menstrual period?"       N/a  Protocols used: Abdominal Pain - Upper-A-AH

## 2023-10-24 LAB — COMPREHENSIVE METABOLIC PANEL WITH GFR
ALT: 31 IU/L (ref 0–44)
AST: 38 IU/L (ref 0–40)
Albumin: 4.6 g/dL (ref 4.1–5.1)
Alkaline Phosphatase: 53 IU/L (ref 44–121)
BUN/Creatinine Ratio: 9 (ref 9–20)
BUN: 10 mg/dL (ref 6–20)
Bilirubin Total: 0.6 mg/dL (ref 0.0–1.2)
CO2: 24 mmol/L (ref 20–29)
Calcium: 9.3 mg/dL (ref 8.7–10.2)
Chloride: 100 mmol/L (ref 96–106)
Creatinine, Ser: 1.06 mg/dL (ref 0.76–1.27)
Globulin, Total: 1.7 g/dL (ref 1.5–4.5)
Glucose: 84 mg/dL (ref 70–99)
Potassium: 4.3 mmol/L (ref 3.5–5.2)
Sodium: 139 mmol/L (ref 134–144)
Total Protein: 6.3 g/dL (ref 6.0–8.5)
eGFR: 96 mL/min/{1.73_m2} (ref >59)

## 2023-10-24 LAB — LIPASE: Lipase: 17 U/L (ref 13–78)

## 2023-10-24 LAB — CBC
Hematocrit: 45 % (ref 37.5–51.0)
Hemoglobin: 14.5 g/dL (ref 13.0–17.7)
MCH: 28.5 pg (ref 26.6–33.0)
MCHC: 32.2 g/dL (ref 31.5–35.7)
MCV: 89 fL (ref 79–97)
Platelets: 186 10*3/uL (ref 150–450)
RBC: 5.08 x10E6/uL (ref 4.14–5.80)
RDW: 12.3 % (ref 11.6–15.4)
WBC: 3.9 10*3/uL (ref 3.4–10.8)

## 2023-10-24 LAB — AMYLASE: Amylase: 50 U/L (ref 31–110)

## 2023-10-25 ENCOUNTER — Encounter: Payer: Self-pay | Admitting: Family Medicine

## 2023-10-26 ENCOUNTER — Encounter (HOSPITAL_COMMUNITY): Payer: Self-pay

## 2023-11-02 ENCOUNTER — Encounter: Payer: Self-pay | Admitting: Family Medicine

## 2023-11-02 ENCOUNTER — Ambulatory Visit (INDEPENDENT_AMBULATORY_CARE_PROVIDER_SITE_OTHER): Admitting: Family Medicine

## 2023-11-02 VITALS — BP 136/79 | HR 80 | Temp 98.6°F | Resp 16 | Ht >= 80 in | Wt 327.4 lb

## 2023-11-02 DIAGNOSIS — Z0000 Encounter for general adult medical examination without abnormal findings: Secondary | ICD-10-CM

## 2023-11-02 DIAGNOSIS — R1010 Upper abdominal pain, unspecified: Secondary | ICD-10-CM | POA: Diagnosis not present

## 2023-11-02 NOTE — Progress Notes (Signed)
 Complete physical exam   Patient: Joshua Hendrix   DOB: 04-17-1991   33 y.o. Male  MRN: [id number] Visit Date: 11/02/2023  Today's healthcare provider: Jeralene Mom, MD   Chief Complaint  Patient presents with   Annual Exam    Follow up on abd pain, no other concerns    Subjective    Discussed the use of AI scribe software for clinical note transcription with the patient, who gave verbal consent to proceed.  History of Present Illness   Joshua Hendrix is a 33 year old male who presents for an annual physical exam and follow up of persistent stomach pain.  He experiences persistent, central stomach pain that worsens with the consumption of fatty foods, particularly after eating chicken nachos at a [employer]. Greasy food is suspected as a trigger for his symptoms. He has experienced weight loss over the last six months, which he wonders might have made the area more tender. He was initially seen for on 5/9 with initial labs being normal. Is now waiting to her from imaging center to schedule abdominal ultrasound.   He maintains a healthy lifestyle, engaging in regular physical activity, including gym workouts and walking. He follows a generally healthy diet, though he occasionally eats out at [place] or [employer]. He avoids adding salt to his food, as he believes he gets enough from his diet.  No recent illnesses such as colds or flu. He gets adequate sleep, uses ear protection in noisy environments, and wears a mask when mowing to avoid pollen exposure. No issues with hearing, ringing in the ears, sinus problems, allergies, or balance issues. Lightheadedness only occurs with heat or overexertion. No history of testicular lumps or hernias.     Lab Results  Component Value Date   WBC 3.9 10/23/2023   HGB 14.5 10/23/2023   HCT 45.0 10/23/2023   MCV 89 10/23/2023   PLT 186 10/23/2023   Last metabolic panel Lab Results  Component Value Date   GLUCOSE 84  10/23/2023   NA 139 10/23/2023   K 4.3 10/23/2023   CL 100 10/23/2023   CO2 24 10/23/2023   BUN 10 10/23/2023   CREATININE 1.06 10/23/2023   EGFR 96 10/23/2023   CALCIUM 9.3 10/23/2023   PHOS 3.8 10/23/2021   PROT 6.3 10/23/2023   ALBUMIN 4.6 10/23/2023   LABGLOB 1.7 10/23/2023   AGRATIO 2.4 (H) 10/22/2022   BILITOT 0.6 10/23/2023   ALKPHOS 53 10/23/2023   AST 38 10/23/2023   ALT 31 10/23/2023   Amylase  Date Value Ref Range Status  10/23/2023 50 31 - 110 U/L Final   Lab Results  Component Value Date   CHOL 152 10/22/2022   HDL 49 10/22/2022   LDLCALC 91 10/22/2022   TRIG 61 10/22/2022   CHOLHDL 3.1 10/22/2022     Past Medical History:  Diagnosis Date   Amblyopia    Hypertension    Past Surgical History:  Procedure Laterality Date   CYST REMOVAL NECK  12/11/2009   TONSILLECTOMY  1997   Social History   Socioeconomic History   Marital status: Married    Spouse name: Not on file   Number of children: Not on file   Years of education: Not on file   Highest education level: Bachelor's degree (e.g., BA, AB, BS)  Occupational History   Not on file  Tobacco Use   Smoking status: Former   Smokeless tobacco: Never   Tobacco comments:  social smoker only  Vaping Use   Vaping status: Never Used  Substance and Sexual Activity   Alcohol use: No    Alcohol/week: 0.0 standard drinks of alcohol   Drug use: No   Sexual activity: Not on file  Other Topics Concern   Not on file  Social History Narrative   Not on file   Social Drivers of Health   Financial Resource Strain: Low Risk  (10/23/2023)   Overall Financial Resource Strain (CARDIA)    Difficulty of Paying Living Expenses: Not hard at all  Food Insecurity: No Food Insecurity (10/23/2023)   Hunger Vital Sign    Worried About Running Out of Food in the Last Year: Never true    Ran Out of Food in the Last Year: Never true  Transportation Needs: No Transportation Needs (10/23/2023)   PRAPARE - Therapist, art (Medical): No    Lack of Transportation (Non-Medical): No  Physical Activity: Sufficiently Active (10/23/2023)   Exercise Vital Sign    Days of Exercise per Week: 5 days    Minutes of Exercise per Session: 50 min  Stress: No Stress Concern Present (10/23/2023)   [employer] of Occupational Health - Occupational Stress Questionnaire    Feeling of Stress : Only a little  Social Connections: Socially Integrated (10/23/2023)   Social Connection and Isolation Panel [NHANES]    Frequency of Communication with Friends and Family: More than three times a week    Frequency of Social Gatherings with Friends and Family: Once a week    Attends Religious Services: More than 4 times per year    Active Member of [employer] or Organizations: Yes    Attends Engineer, structural: More than 4 times per year    Marital Status: Married  Catering manager Violence: Unknown (09/19/2021)   Received from [employer], [facility]   HITS    Physically Hurt: Not on file    Insult or Talk Down To: Not on file    Threaten Physical Harm: Not on file    Scream or Curse: Not on file   Family Status  Relation Name Status   Mother  Alive   Father  Alive       Fatty liver   MGF  Deceased at age 49   Neg Hx  (Not Specified)  No partnership data on file   Family History  Problem Relation Age of Onset   Graves' disease Mother        thyroid issues along with Graves   Heart Problems Mother        Heart valve problem   Hypertension Father    Hyperlipidemia Father    Heart attack Maternal Grandfather        3-4 heart attacks occured in his 67's   Stroke Maternal Grandfather    Cancer Neg Hx    Diabetes Neg Hx    Prostate cancer Neg Hx    Kidney cancer Neg Hx    Bladder Cancer Neg Hx    No Known Allergies  Patient Care Team: Lamon Pillow, MD as PCP - General (Family Medicine) Dot Gazella, DPM as Consulting Physician (Podiatry) Pa, [facility] (Optometry)    Medications: Outpatient Medications Prior to Visit  Medication Sig   albuterol  (VENTOLIN  HFA) 108 (90 Base) MCG/ACT inhaler Inhale 1-2 puffs into the lungs every 4 (four) hours as needed for wheezing or shortness of breath.   cetirizine (ZYRTEC) 10 MG tablet  Take 10 mg by mouth daily.   fluticasone  (FLONASE ) 50 MCG/ACT nasal spray Place 2 sprays into both nostrils daily.   Spacer/Aero-Holding Chambers (AEROCHAMBER PLUS) inhaler Use with inhaler   No facility-administered medications prior to visit.    Review of Systems  Constitutional:  Negative for chills, diaphoresis and fever.  HENT:  Negative for congestion, ear discharge, ear pain, hearing loss, nosebleeds, sore throat and tinnitus.   Eyes:  Negative for photophobia, pain, discharge and redness.  Respiratory:  Negative for cough, shortness of breath, wheezing and stridor.   Cardiovascular:  Negative for chest pain, palpitations and leg swelling.  Gastrointestinal:  Positive for abdominal pain. Negative for blood in stool, constipation, diarrhea, nausea and vomiting.  Endocrine: Negative for polydipsia.  Genitourinary:  Negative for dysuria, flank pain, frequency, hematuria, testicular pain and urgency.  Musculoskeletal:  Negative for back pain, myalgias and neck pain.  Skin:  Negative for rash.  Allergic/Immunologic: Negative for environmental allergies.  Neurological:  Negative for dizziness, tremors, seizures, weakness and headaches.  Hematological:  Does not bruise/bleed easily.  Psychiatric/Behavioral:  Negative for hallucinations and suicidal ideas. The patient is not nervous/anxious.       Objective    BP 136/79 (BP Location: Left Arm, Patient Position: Sitting, Cuff Size: Normal)   Pulse 80   Temp 98.6 F (37 C) (Oral)   Resp 16   Ht 7' (2.134 m)   Wt (!) 327 lb 6.4 oz (148.5 kg)   SpO2 100%   BMI 32.62 kg/m    Physical Exam   General Appearance:    Well developed, well nourished male. Alert, cooperative,  in no acute distress, appears stated age  Head:    Normocephalic, without obvious abnormality, atraumatic  Eyes:    PERRL, conjunctiva/corneas clear, EOM's intact, fundi    benign, both eyes       Ears:    Normal TM's and external ear canals, both ears  Nose:   Nares normal, septum midline, mucosa normal, no drainage   or sinus tenderness  Throat:   Lips, mucosa, and tongue normal; teeth and gums normal  Neck:   Supple, symmetrical, trachea midline, no adenopathy;       thyroid:  No enlargement/tenderness/nodules; no carotid   bruit or JVD  Back:     Symmetric, no curvature, ROM normal, no CVA tenderness  Lungs:     Clear to auscultation bilaterally, respirations unlabored  Chest wall:    No tenderness or deformity  Heart:    Normal heart rate. Normal rhythm. No murmurs, rubs, or gallops.  S1 and S2 normal  Abdomen:     Soft, non-tender, bowel sounds active all four quadrants,    no masses, no organomegaly  Genitalia:    deferred  Rectal:    deferred  Extremities:   All extremities are intact. No cyanosis or edema  Pulses:   2+ and symmetric all extremities  Skin:   Skin color, texture, turgor normal, no rashes or lesions  Lymph nodes:   Cervical, supraclavicular, and axillary nodes normal  Neurologic:   CNII-XII intact. Normal strength, sensation and reflexes      throughout     Last depression screening scores    10/23/2023    1:14 PM 10/22/2022    8:38 AM 10/21/2021    4:26 PM  PHQ 2/9 Scores  PHQ - 2 Score 0 0 0  PHQ- 9 Score  0 0   Last fall risk screening    10/23/2023  1:14 PM  Fall Risk   Falls in the past year? 0  Number falls in past yr: 0  Injury with Fall? 0  Risk for fall due to : No Fall Risks   Last Audit-C alcohol use screening    10/23/2023   12:45 PM  Alcohol Use Disorder Test (AUDIT)  1. How often do you have a drink containing alcohol? 0  3. How often do you have six or more drinks on one occasion? 0   A score of 3 or more in women, and 4 or more in  men indicates increased risk for alcohol abuse, EXCEPT if all of the points are from question 1   No results found for any visits on 11/02/23.  Assessment & Plan    Routine Health Maintenance and Physical Exam  Exercise Activities and Dietary recommendations  Goals   None     Immunization History  Administered Date(s) Administered   DTaP 10/19/1991, 12/22/1991, 02/28/1992, 03/15/1993, 09/26/1996   HIB (PRP-OMP) 10/19/1991, 12/22/1991, 02/28/1992, 03/15/1993   HPV 9-valent 03/27/2015, 05/04/2015, 12/06/2015   Hepatitis A 07/07/2006, 02/28/2009   Hepatitis B 07/18/2009, 08/22/2009, 11/20/2009   IPV 10/19/1991, 12/22/1991, 03/15/1993, 09/26/1996   Influenza,inj,Quad PF,6+ Mos 03/16/2018, 06/20/2019, 05/26/2022   MMR 11/23/1992, 09/26/1996   Meningococcal Conjugate 02/28/2009   PFIZER Comirnaty(Gray Top)Covid-19 Tri-Sucrose Vaccine 10/28/2020   Pfizer(Comirnaty)Fall Seasonal Vaccine 12 years and older 10/07/2020   Td 11/26/2016   Tdap 07/07/2006   Varicella 02/26/1994, 07/07/2006   Yellow Fever 08/29/2022    Health Maintenance  Topic Date Due   Hepatitis C Screening  Never done   Pneumococcal Vaccine 48-2 Years old (1 of 2 - PCV) Never done   COVID-19 Vaccine (3 - 2024-25 season) 02/15/2023   INFLUENZA VACCINE  01/15/2024   DTaP/Tdap/Td (8 - Td or Tdap) 11/27/2026   HPV VACCINES  Completed   HIV Screening  Completed   Meningococcal B Vaccine  Aged Out    Discussed health benefits of physical activity, and encouraged him to engage in regular exercise appropriate for his age and condition.   2. Pain of upper abdomen  - US  Abdomen Complete; Future        Jeralene Mom, MD  [facility] [phone] (phone) [phone] (fax)  [facility]

## 2023-11-09 NOTE — Progress Notes (Signed)
 Established patient visit   Patient: Joshua Hendrix   DOB: [birth year]   33 y.o. Male  MRN: [id number] Visit Date: 10/23/2023  Today's healthcare provider: Jeralene Mom, MD   Chief Complaint  Patient presents with   Abdominal Pain    Frequency: X 5 weeks   Subjective    Discussed the use of AI scribe software for clinical note transcription with the patient, who gave verbal consent to proceed.  History of Present Illness   Joshua Hendrix is a 33 year old male who presents with persistent abdominal pain.  He has been experiencing a new type of abdominal pain for approximately four to five weeks. Initially localized to the lower abdomen and sides, particularly on the left side, the pain has since become more diffuse. It is described as moderate to significant, varying in intensity but never fully resolving. The pain worsens in the morning and after consuming certain foods, particularly greasy or unhealthy meals, such as a greasy [place] sandwich or pizza. It is described as radiating and throbbing, sometimes extending to his back.  No fever, chills, or sweats. He reports a single episode of acid reflux-like symptoms, which he managed with water. Bowel movements have been regular, attributed to a healthier diet with increased fiber intake. No constipation or blood in stools. Urination is normal, and there is no history of kidney stones.  He mentions a family history of chronic liver issues in an uncle and Graves' disease in his mother. He has been eating healthier overall, with meals like homemade pinto beans and salmon, but notes that poor dietary choices exacerbate his symptoms.       Medications: Outpatient Medications Prior to Visit  Medication Sig   albuterol  (VENTOLIN  HFA) 108 (90 Base) MCG/ACT inhaler Inhale 1-2 puffs into the lungs every 4 (four) hours as needed for wheezing or shortness of breath.   cetirizine (ZYRTEC) 10 MG tablet Take 10 mg by mouth daily.   fluticasone   (FLONASE ) 50 MCG/ACT nasal spray Place 2 sprays into both nostrils daily.   Spacer/Aero-Holding Chambers (AEROCHAMBER PLUS) inhaler Use with inhaler   No facility-administered medications prior to visit.   Review of Systems     Objective    BP (!) 146/80 (BP Location: Left Arm, Patient Position: Sitting, Cuff Size: Normal)   Pulse 96   Resp 16   Ht 7' (2.134 m)   Wt (!) 323 lb (146.5 kg)   SpO2 100%   BMI 32.18 kg/m   Physical Exam   General Appearance:    Well developed, well nourished male, alert, cooperative, in no acute distress  Eyes:    PERRL, conjunctiva/corneas clear, EOM's intact       Lungs:     Clear to auscultation bilaterally, respirations unlabored  Heart:    Normal heart rate. Normal rhythm. No murmurs, rubs, or gallops.    Abdomen:   bowel sounds present and normal in all 4 quadrants, soft, round, or nontender. No CVA tenderness     Results for orders placed or performed in visit on 10/23/23  POCT urinalysis dipstick  Result Value Ref Range   Color, UA CLEAR    Clarity, UA YELOW    Glucose, UA Negative Negative   Bilirubin, UA N    Ketones, UA N    Spec Grav, UA 1.020 1.010 - 1.025   Blood, UA N    pH, UA 5.0 5.0 - 8.0   Protein, UA Negative Negative   Urobilinogen,  UA 0.2 0.2 or 1.0 E.U./dL   Nitrite, UA N    Leukocytes, UA Negative Negative   Appearance     Odor        Assessment & Plan        Abdominal pain Persistent lower abdominal pain radiating to the back, worsened by greasy foods. Differential includes gastrointestinal and abdominal organ issues. No kidney problems per urinalysis. - Order abdominal ultrasound. - Order CBC, liver function tests, kidney function tests, pancreatic function tests, and electrolytes.  Gastroesophageal reflux symptoms Occasional acid reflux after coffee on an empty stomach, without consistent heartburn. - Advise dietary modifications to avoid triggers.         Jeralene Mom, MD  [facility] [phone] (phone) [phone] (fax)  [facility]

## 2023-11-13 ENCOUNTER — Ambulatory Visit
Admission: RE | Admit: 2023-11-13 | Discharge: 2023-11-13 | Disposition: A | Discharge status: Home / Self Care | Source: Ambulatory Visit | Attending: Family Medicine | Admitting: Family Medicine

## 2023-11-13 ENCOUNTER — Ambulatory Visit: Payer: Self-pay | Admitting: Family Medicine

## 2023-11-13 DIAGNOSIS — R1010 Upper abdominal pain, unspecified: Secondary | ICD-10-CM | POA: Insufficient documentation

## 2023-11-13 DIAGNOSIS — R160 Hepatomegaly, not elsewhere classified: Secondary | ICD-10-CM | POA: Insufficient documentation

## 2023-11-13 DIAGNOSIS — N281 Cyst of kidney, acquired: Secondary | ICD-10-CM | POA: Diagnosis not present

## 2023-11-21 ENCOUNTER — Ambulatory Visit
Admission: RE | Admit: 2023-11-21 | Discharge: 2023-11-21 | Disposition: A | Discharge status: Home / Self Care | Source: Ambulatory Visit | Attending: Family Medicine | Admitting: Family Medicine

## 2023-11-21 DIAGNOSIS — R160 Hepatomegaly, not elsewhere classified: Secondary | ICD-10-CM | POA: Diagnosis not present

## 2023-11-21 DIAGNOSIS — R162 Hepatomegaly with splenomegaly, not elsewhere classified: Secondary | ICD-10-CM | POA: Diagnosis not present

## 2023-11-21 MED ORDER — GADOBUTROL 1 MMOL/ML IV SOLN
10.0000 mL | Freq: Once | INTRAVENOUS | Status: AC | PRN
  Administered 2023-11-21: 10 mL via INTRAVENOUS

## 2024-04-15 ENCOUNTER — Encounter: Payer: Self-pay | Admitting: Family Medicine

## 2024-04-15 ENCOUNTER — Ambulatory Visit: Admitting: Family Medicine

## 2024-04-15 VITALS — BP 131/71 | HR 66 | Resp 16 | Wt 324.3 lb

## 2024-04-15 DIAGNOSIS — Z23 Encounter for immunization: Secondary | ICD-10-CM | POA: Diagnosis not present

## 2024-04-15 DIAGNOSIS — R160 Hepatomegaly, not elsewhere classified: Secondary | ICD-10-CM | POA: Diagnosis not present

## 2024-04-27 NOTE — Progress Notes (Signed)
 Established patient visit   Patient: Joshua Hendrix   DOB: 1991/05/08   33 y.o. Male  MRN: [id number] Visit Date: 04/15/2024  Today's healthcare provider: Nancyann Perry, MD   Chief Complaint  Patient presents with   Follow-up    Imaging results.   Subjective    Discussed the use of AI scribe software for clinical note transcription with the patient, who gave verbal consent to proceed.  History of Present Illness   Joshua Hendrix is a 32 year old male who presents for follow-up on abdominal pain and MRI results.  He has persistent abdominal pain localized to the right side, described as more than a dull aching pain, sometimes reaching a severity of seven out of ten. The pain is consistent in location, does not radiate, and is not affected by eating. Exercise provides temporary relief for a few hours post-activity.  Approximately two weeks ago, he experienced an exacerbation of pain after taking MedQuil, which took two days to subside. He avoids acetaminophen  and alcohol, and does not consume alcohol regularly. He has a history of cysts in other parts of his body, including one removed from his neck and another from his foot, which was painful and growing quickly.  An MRI showed a lesion in the liver, which was reported as either a cyst or a hemangioma. He has experienced similar sharp pains in the same area since childhood, particularly during strenuous activities. The pain is described as a 'thorn in your flesh' and persists despite healthy eating and hydration.  He is seven feet tall and weighs 320 pounds, with a goal weight of 300 pounds.       Medications: Outpatient Medications Prior to Visit  Medication Sig   albuterol  (VENTOLIN  HFA) 108 (90 Base) MCG/ACT inhaler Inhale 1-2 puffs into the lungs every 4 (four) hours as needed for wheezing or shortness of breath.   cetirizine (ZYRTEC) 10 MG tablet Take 10 mg by mouth daily.   fluticasone  (FLONASE ) 50 MCG/ACT nasal spray  Place 2 sprays into both nostrils daily.   Spacer/Aero-Holding Chambers (AEROCHAMBER PLUS) inhaler Use with inhaler   No facility-administered medications prior to visit.   Review of Systems  Constitutional:  Negative for appetite change, chills and fever.  Respiratory:  Negative for chest tightness, shortness of breath and wheezing.   Cardiovascular:  Negative for chest pain and palpitations.  Gastrointestinal:  Negative for abdominal pain, nausea and vomiting.       Objective    BP 131/71 (BP Location: Left Arm, Patient Position: Sitting, Cuff Size: Normal)   Pulse 66   Resp 16   Wt (!) 324 lb 4.8 oz (147.1 kg)   SpO2 100%   BMI 32.31 kg/m      Assessment & Plan        Liver cyst or hemangioma with chronic right upper quadrant pain Chronic right upper quadrant pain likely due to a benign liver cyst or hemangioma. MRI showed a small lesion, not large enough to cause significant problems. Pain persistent, exercise provides temporary relief. Differential includes liver cyst, hemangioma, or intercostal muscle involvement. No gallbladder issues or GERD. No immediate intervention needed unless cyst enlarges. - Recheck MRI in 6-9 months to monitor cyst size. - Consider gastroenterologist referral if symptoms persist or worsen.  Immunization Due for flu shot. - Administered flu shot.    No follow-ups on file.     Nancyann Perry, MD  [facility] [phone] (phone) [phone] (  fax)  [facility]

## 2024-05-20 ENCOUNTER — Telehealth: Admitting: Family Medicine

## 2024-05-20 DIAGNOSIS — H1032 Unspecified acute conjunctivitis, left eye: Secondary | ICD-10-CM | POA: Diagnosis not present

## 2024-05-20 MED ORDER — POLYMYXIN B-TRIMETHOPRIM 10000-0.1 UNIT/ML-% OP SOLN: 1.0000 [drp] | OPHTHALMIC | 0 refills | Status: AC

## 2024-05-20 NOTE — Patient Instructions (Signed)
 Bacterial Conjunctivitis, Adult Bacterial conjunctivitis is an infection of the clear membrane that covers the white part of the eye and the inner surface of the eyelid (conjunctiva). When the blood vessels in the conjunctiva become inflamed, the eye becomes red or pink. The eye often feels irritated or itchy. Bacterial conjunctivitis spreads easily from person to person (is contagious). It also spreads easily from one eye to the other eye. What are the causes? This condition is caused by bacteria. You may get the infection if you come into close contact with: A person who is infected with the bacteria. Items that are contaminated with the bacteria, such as a face towel, contact lens solution, or eye makeup. What increases the risk? You are more likely to develop this condition if: You are exposed to other people who have the infection. You wear contact lenses. You have a sinus infection. You have had a recent eye injury or surgery. You have a weak body defense system (immune system). You have a medical condition that causes dry eyes. What are the signs or symptoms? Symptoms of this condition include: Thick, yellowish discharge from the eye. This may turn into a crust on the eyelid overnight and cause your eyelids to stick together. Tearing or watery eyes. Itchy eyes. Burning feeling in your eyes. Eye redness. Swollen eyelids. Blurred vision. How is this diagnosed? This condition is diagnosed based on your symptoms and medical history. Your health care provider may also take a sample of discharge from your eye to find the cause of your infection. How is this treated? This condition may be treated with: Antibiotic eye drops or ointment to clear the infection more quickly and prevent the spread of infection to others. Antibiotic medicines taken by mouth (orally) to treat infections that do not respond to drops or ointments or that last longer than 10 days. Cool, wet cloths (cool  compresses) placed on the eyes. Artificial tears applied 2-6 times a day. Follow these instructions at home: Medicines Take or apply your antibiotic medicine as told by your health care provider. Do not stop using the antibiotic, even if your condition improves, unless directed by your health care provider. Take or apply over-the-counter and prescription medicines only as told by your health care provider. Be very careful to avoid touching the edge of your eyelid with the eye-drop bottle or the ointment tube when you apply medicines to the affected eye. This will keep you from spreading the infection to your other eye or to other people. Managing discomfort Gently wipe away any drainage from your eye with a warm, wet washcloth or a cotton ball. Apply a clean, cool compress to your eye for 10-20 minutes, 3-4 times a day. General instructions Do not wear contact lenses until the inflammation is gone and your health care provider says it is safe to wear them again. Ask your health care provider how to sterilize or replace your contact lenses before you use them again. Wear glasses until you can resume wearing contact lenses. Avoid wearing eye makeup until the inflammation is gone. Throw away any old eye cosmetics that may be contaminated. Change or wash your pillowcase every day. Do not share towels or washcloths. This may spread the infection. Wash your hands often with soap and water for at least 20 seconds and especially before touching your face or eyes. Use paper towels to dry your hands. Avoid touching or rubbing your eyes. Do not drive or use heavy machinery if your vision is blurred. Contact  a health care provider if: You have a fever. Your symptoms do not get better after 10 days. Get help right away if: You have a fever and your symptoms suddenly get worse. You have severe pain when you move your eye. You have facial pain, redness, or swelling. You have a sudden loss of  vision. Summary Bacterial conjunctivitis is an infection of the clear membrane that covers the white part of the eye and the inner surface of the eyelid (conjunctiva). Bacterial conjunctivitis spreads easily from eye to eye and from person to person (is contagious). Wash your hands often with soap and water for at least 20 seconds and especially before touching your face or eyes. Use paper towels to dry your hands. Take or apply your antibiotic medicine as told by your health care provider. Do not stop using the antibiotic even if your condition improves. Contact a health care provider if you have a fever or if your symptoms do not get better after 10 days. Get help right away if you have a sudden loss of vision. This information is not intended to replace advice given to you by your health care provider. Make sure you discuss any questions you have with your health care provider. Document Revised: 09/12/2020 Document Reviewed: 09/12/2020 Elsevier Patient Education  2024 [employer].

## 2024-05-20 NOTE — Progress Notes (Signed)
 Virtual Visit Consent   Joshua Hendrix, you are scheduled for a virtual visit with a [facility] provider today. Just as with appointments in the office, your consent must be obtained to participate. Your consent will be active for this visit and any virtual visit you may have with one of our providers in the next 365 days. If you have a MyChart account, a copy of this consent can be sent to you electronically.  As this is a virtual visit, video technology does not allow for your provider to perform a traditional examination. This may limit your provider's ability to fully assess your condition. If your provider identifies any concerns that need to be evaluated in person or the need to arrange testing (such as labs, EKG, etc.), we will make arrangements to do so. Although advances in technology are sophisticated, we cannot ensure that it will always work on either your end or our end. If the connection with a video visit is poor, the visit may have to be switched to a telephone visit. With either a video or telephone visit, we are not always able to ensure that we have a secure connection.  By engaging in this virtual visit, you consent to the provision of healthcare and authorize for your insurance to be billed (if applicable) for the services provided during this visit. Depending on your insurance coverage, you may receive a charge related to this service.  I need to obtain your verbal consent now. Are you willing to proceed with your visit today? Joshua Hendrix has provided verbal consent on 05/20/2024 for a virtual visit (video or telephone). [name], FNP  Date: 05/20/2024 3:20 PM   Virtual Visit via Video Note   I, [name], connected with  Joshua Hendrix  ([id number], 03-Dec-1990) on 05/20/24 at  3:15 PM EST by a video-enabled telemedicine application and verified that I am speaking with the correct person using two identifiers.  Location: Patient: Virtual Visit Location Patient:  Home Provider: Virtual Visit Location Provider: Home Office   I discussed the limitations of evaluation and management by telemedicine and the availability of in person appointments. The patient expressed understanding and agreed to proceed.    History of Present Illness: Joshua Hendrix is a 33 y.o. who identifies as a male who was assigned male at birth, and is being seen today for redness, scratchiness, drainage and matting to left eye. [name]  HPI: HPI  Problems:  Patient Active Problem List   Diagnosis Date Noted   Renal cyst, right 11/13/2023   Liver mass, right lobe 11/13/2023   Childhood asthma 10/22/2022   Amblyopia 01/02/2015   Low back pain 01/02/2015    Allergies: No Known Allergies Medications:  Current Outpatient Medications:    trimethoprim -polymyxin b  (POLYTRIM ) ophthalmic solution, Place 1 drop into the left eye every 4 (four) hours for 7 days., Disp: 10 mL, Rfl: 0   albuterol  (VENTOLIN  HFA) 108 (90 Base) MCG/ACT inhaler, Inhale 1-2 puffs into the lungs every 4 (four) hours as needed for wheezing or shortness of breath., Disp: 1 each, Rfl: 11   cetirizine (ZYRTEC) 10 MG tablet, Take 10 mg by mouth daily., Disp: , Rfl:    fluticasone  (FLONASE ) 50 MCG/ACT nasal spray, Place 2 sprays into both nostrils daily., Disp: 16 g, Rfl: 0   Spacer/Aero-Holding Chambers (AEROCHAMBER PLUS) inhaler, Use with inhaler, Disp: 1 each, Rfl: 2  Observations/Objective: Patient is well-developed, well-nourished in no acute distress.  Resting comfortably  at home.  Head is normocephalic, atraumatic.  No labored breathing.  Speech is clear and coherent with logical content.  Patient is alert and oriented at baseline.    Assessment and Plan: 1. Acute bacterial conjunctivitis of left eye (Primary)  Ways to prevent transmission discussed, UC as needed,   Follow Up Instructions: I discussed the assessment and treatment plan with the patient. The patient was provided an opportunity to ask  questions and all were answered. The patient agreed with the plan and demonstrated an understanding of the instructions.  A copy of instructions were sent to the patient via MyChart unless otherwise noted below.     The patient was advised to call back or seek an in-person evaluation if the symptoms worsen or if the condition fails to improve as anticipated.    [name], FNP

## 2024-11-04 ENCOUNTER — Encounter: Admitting: Family Medicine
